# Patient Record
Sex: Female | Born: 2000 | Race: Black or African American | Hispanic: No | Marital: Single | Smoking: Former smoker
Health system: Southern US, Community
[De-identification: ages and names within clinical notes are randomized; demographics above are authoritative.]

## PROBLEM LIST (undated history)

## (undated) ENCOUNTER — Inpatient Hospital Stay: Payer: Self-pay

## (undated) DIAGNOSIS — Q213 Tetralogy of Fallot: Secondary | ICD-10-CM

## (undated) DIAGNOSIS — D563 Thalassemia minor: Secondary | ICD-10-CM

## (undated) DIAGNOSIS — IMO0001 Reserved for inherently not codable concepts without codable children: Secondary | ICD-10-CM

## (undated) DIAGNOSIS — H6690 Otitis media, unspecified, unspecified ear: Secondary | ICD-10-CM

## (undated) DIAGNOSIS — R21 Rash and other nonspecific skin eruption: Secondary | ICD-10-CM

## (undated) DIAGNOSIS — T148XXA Other injury of unspecified body region, initial encounter: Secondary | ICD-10-CM

## (undated) DIAGNOSIS — I519 Heart disease, unspecified: Secondary | ICD-10-CM

## (undated) DIAGNOSIS — H9190 Unspecified hearing loss, unspecified ear: Secondary | ICD-10-CM

## (undated) DIAGNOSIS — K5900 Constipation, unspecified: Secondary | ICD-10-CM

## (undated) DIAGNOSIS — F99 Mental disorder, not otherwise specified: Secondary | ICD-10-CM

## (undated) DIAGNOSIS — R079 Chest pain, unspecified: Secondary | ICD-10-CM

## (undated) DIAGNOSIS — R519 Headache, unspecified: Secondary | ICD-10-CM

## (undated) DIAGNOSIS — K219 Gastro-esophageal reflux disease without esophagitis: Secondary | ICD-10-CM

## (undated) DIAGNOSIS — D539 Nutritional anemia, unspecified: Secondary | ICD-10-CM

## (undated) DIAGNOSIS — R569 Unspecified convulsions: Secondary | ICD-10-CM

## (undated) HISTORY — DX: Headache, unspecified: R51.9

## (undated) HISTORY — PX: HX HEART SURGERY: 2100001149

## (undated) HISTORY — DX: Mental disorder, not otherwise specified: F99

## (undated) HISTORY — DX: Unspecified convulsions: R56.9

## (undated) HISTORY — PX: PB INJECT W CARD CATH LV/LA ANGIO: 93543

## (undated) HISTORY — PX: PB REPR ASD W BYPASS: 33641

## (undated) HISTORY — DX: Otitis media, unspecified, unspecified ear: H66.90

## (undated) HISTORY — DX: Heart disease, unspecified: I51.9

## (undated) HISTORY — PX: PB REPLACEMENT, PULMONARY VALVE: 33475

## (undated) HISTORY — DX: Unspecified hearing loss, unspecified ear: H91.90

## (undated) HISTORY — DX: Chest pain, unspecified: R07.9

## (undated) HISTORY — PX: PB REPR TET FALLOT W PULM ATRESIA: 33697

## (undated) HISTORY — DX: Constipation, unspecified: K59.00

## (undated) HISTORY — DX: Reserved for inherently not codable concepts without codable children: IMO0001

## (undated) HISTORY — DX: Nutritional anemia, unspecified: D53.9

## (undated) HISTORY — PX: CORONARY ARTERY BYPASS GRAFT: SHX141

## (undated) NOTE — Progress Notes (Signed)
 Formatting of this note is different from the original.  Images from the original note were not included.  Subjective   Patient ID: [name] is a 31 y.o. female presenting to the Urgent Care with a chief complaint of Other (She was moving boxes dropped a box onto her chest. Has soreness x1day ).    Pt here today d/t chest wall pain. She states that she was moving out of her home -and placing boxes in storage. She states she was putting a box on a shelf, and then the box fell on her chest. This occurred yesterday. Denies shortness of breath, N/V, diaphoresis, dizziness.      History provided by:  Patient  Language interpreter used: No      Objective   BP 115/77 (BP Location: Right arm, Patient Position: Sitting, BP Cuff Size: Adult)   Pulse (!) 122   Temp 36.8 C (98.3 F) (Temporal)   Resp 18   Ht 1.524 m (5')   Wt 63.5 kg (140 lb)   LMP 02/13/2024   SpO2 99%   BMI 27.34 kg/m     Physical Exam  Vitals and nursing note reviewed.   Constitutional:       Appearance: Normal appearance.   HENT:      Head: Normocephalic.   Eyes:      Extraocular Movements: Extraocular movements intact.      Pupils: Pupils are equal, round, and reactive to light.   Cardiovascular:      Rate and Rhythm: Normal rate and regular rhythm.   Pulmonary:      Effort: Pulmonary effort is normal.      Breath sounds: Normal breath sounds.   Chest:      Chest wall: Tenderness present.        Comments: Tenderness with palpation noted above.    Skin:     General: Skin is warm and dry.      Capillary Refill: Capillary refill takes less than 2 seconds.   Neurological:      General: No focal deficit present.      Mental Status: She is alert and oriented to person, place, and time.   Psychiatric:         Mood and Affect: Mood normal.         Behavior: Behavior normal.         Assessment & Plan    Assessment & Plan  Acute chest wall pain    Orders:    XR chest 2 views    ibuprofen  tablet 600 mg    methocarbamol (Robaxin) 750 MG tablet; Take 1  tablet by mouth every 8 hours if needed for muscle spasms (pain/spasms.) for up to 10 days. Do not drive or operate heavy machinery    Other orders    ibuprofen  600 MG tablet; Take 1 tablet by mouth every 6 hours if needed for mild pain for up to 10 days.    In-House Lab Results:   No results found for this or any previous visit.     In-House Imaging Reads:  X-Ray Wet Read Documentation  XR chest 2 views        Examination  Description: Chest xray, frontal and lateral views    Comparisons:  None provided.      Findings  The cardiomediastinal silhouette is unremarkable. Mediastinal cerclage wires     The lungs are clear. No consolidation is present.     No pleural effusion is noted.  There is no pneumothorax present.     The soft tissues and bones are unremarkable for age.     IMPRESSION:    Negative examination for age. No consolidation.    Electronically signed by: Tanda Lory, MD FACR on 03/08/2024 01:15:27 PM US [place]          PRELIMINARY FINDINGS: Appears negative for rib fx  VIEWS: Chest 2 view  RULE OUT: fx/dislocation  PRELIMINARY READ BY: Lauraine Kirsch, FNP-C  IF ANY DISCREPANCIES WITH FINAL READ BY RADIOLOGIST, PATIENT WILL BE NOTIFIED  OF FINDINGS AND FURTHER INSTRUCTION AND TREATMENT.      Procedure Documentation:  Procedures     ED Course & MDM   MDM - Medical Decision Making: Reviewed previous Chart  Electronically signed by Lauraine Kirsch, CRNP at 03/08/2024  2:00 PM EDT

---
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
## 2019-01-05 MED ADMIN — insulin lispro 100 unit/mL subcutaneous solution: @ 11:00:00

## 2019-01-14 MED ADMIN — enoxaparin 60 mg/0.6 mL subcutaneous syringe: @ 09:00:00

## 2019-04-02 ENCOUNTER — Emergency Department (HOSPITAL_COMMUNITY): Payer: Medicaid - Out of State

## 2019-04-02 ENCOUNTER — Encounter (HOSPITAL_COMMUNITY): Payer: Self-pay | Admitting: *Deleted

## 2019-04-02 ENCOUNTER — Emergency Department (HOSPITAL_COMMUNITY)
Admission: EM | Admit: 2019-04-02 | Discharge: 2019-04-02 | Disposition: A | Discharge status: Home / Self Care | Payer: Medicaid - Out of State | Attending: Emergency Medicine | Admitting: Emergency Medicine

## 2019-04-02 ENCOUNTER — Other Ambulatory Visit: Payer: Self-pay

## 2019-04-02 DIAGNOSIS — R300 Dysuria: Secondary | ICD-10-CM

## 2019-04-02 DIAGNOSIS — R0602 Shortness of breath: Secondary | ICD-10-CM | POA: Diagnosis not present

## 2019-04-02 DIAGNOSIS — Q213 Tetralogy of Fallot: Secondary | ICD-10-CM | POA: Diagnosis not present

## 2019-04-02 DIAGNOSIS — Z951 Presence of aortocoronary bypass graft: Secondary | ICD-10-CM | POA: Diagnosis not present

## 2019-04-02 DIAGNOSIS — R0789 Other chest pain: Secondary | ICD-10-CM

## 2019-04-02 HISTORY — DX: Tetralogy of Fallot: Q21.3

## 2019-04-02 LAB — BASIC METABOLIC PANEL
Anion gap: 6 (ref 5–15)
BUN: 11 mg/dL (ref 6–20)
CO2: 23 mmol/L (ref 22–32)
Calcium: 9.6 mg/dL (ref 8.9–10.3)
Chloride: 105 mmol/L (ref 98–111)
Creatinine, Ser: 0.67 mg/dL (ref 0.44–1.00)
GFR calc Af Amer: >60 mL/min (ref >60)
GFR calc non Af Amer: >60 mL/min (ref >60)
Glucose, Bld: 118 mg/dL — ABNORMAL HIGH (ref 70–99)
Potassium: 3.6 mmol/L (ref 3.5–5.1)
Sodium: 134 mmol/L — ABNORMAL LOW (ref 135–145)

## 2019-04-02 LAB — URINALYSIS, ROUTINE W REFLEX MICROSCOPIC
Bacteria, UA: NONE SEEN
Bilirubin Urine: NEGATIVE
Glucose, UA: NEGATIVE mg/dL
Hgb urine dipstick: NEGATIVE
Ketones, ur: NEGATIVE mg/dL
Leukocytes,Ua: NEGATIVE
Nitrite: NEGATIVE
Protein, ur: NEGATIVE mg/dL
Specific Gravity, Urine: 1.017 (ref 1.005–1.030)
pH: 7 (ref 5.0–8.0)

## 2019-04-02 LAB — CBC
HCT: 41 % (ref 36.0–46.0)
Hemoglobin: 13.3 g/dL (ref 12.0–15.0)
MCH: 26.4 pg (ref 26.0–34.0)
MCHC: 32.4 g/dL (ref 30.0–36.0)
MCV: 81.3 fL (ref 80.0–100.0)
Platelets: 334 10*3/uL (ref 150–400)
RBC: 5.04 MIL/uL (ref 3.87–5.11)
RDW: 14.5 % (ref 11.5–15.5)
WBC: 7.8 10*3/uL (ref 4.0–10.5)
nRBC: 0 % (ref 0.0–0.2)

## 2019-04-02 LAB — I-STAT BETA HCG BLOOD, ED (MC, WL, AP ONLY): I-stat hCG, quantitative: <5 m[IU]/mL (ref <5)

## 2019-04-02 LAB — TROPONIN I (HIGH SENSITIVITY): Troponin I (High Sensitivity): <2 ng/L (ref <18)

## 2019-04-02 MED ORDER — SODIUM CHLORIDE 0.9% FLUSH
3.0000 mL | Freq: Once | INTRAVENOUS | Status: AC
  Administered 2019-04-02: 3 mL via INTRAVENOUS

## 2019-04-02 NOTE — ED Notes (Signed)
2nd trop not needed at this time

## 2019-04-02 NOTE — ED Notes (Signed)
Pt verbalized understanding of discharge and follow-up appts. She has no further complaints or questions

## 2019-04-02 NOTE — ED Provider Notes (Signed)
[facility] EMERGENCY DEPARTMENT Provider Note   CSN: [id number] Arrival date & time: 04/02/19  2028     History   Chief Complaint Chief Complaint  Patient presents with  . Chest Pain    HPI Claire Christensen is a 18 y.o. female.     Pt with hx of tetrology of fallot presents with chest pain and intermittent shortness of breath.  Worse past 3 days and with flexion of her head.  Mild urinary sxs and back pain chronic from epidural hx.  [facility], all surgeries done there.  9 mo had stent placed, 18 yo tissue valve and 18 yo had cardiac surgeries.  Most recent pig valve placement.  Compliant with aspirin.  Recently moved to area and looking for local doctors.   No syncope.  No covid contacts.   Dr Barnabas Harries peds cardiology.      Past Medical History:  Diagnosis Date  . Tetralogy of Fallot     There are no active problems to display for this patient.   Past Surgical History:  Procedure Laterality Date  . CORONARY ARTERY BYPASS GRAFT       OB History   No obstetric history on file.      Home Medications    Prior to Admission medications   Medication Sig Start Date End Date Taking? Authorizing Provider  aspirin EC 81 MG tablet Take 81 mg by mouth daily.   Yes [provider]    Family History No family history on file.  Social History Social History   Tobacco Use  . Smoking status: Never Smoker  Substance Use Topics  . Alcohol use: Yes  . Drug use: Not Currently     Allergies   Augmentin [amoxicillin-pot clavulanate], Omnicef [cefdinir], and Penicillins   Review of Systems Review of Systems  Constitutional: Negative for chills and fever.  HENT: Negative for congestion.   Eyes: Negative for visual disturbance.  Respiratory: Positive for shortness of breath (intermittent). Negative for cough.   Cardiovascular: Positive for chest pain.  Gastrointestinal: Positive for nausea. Negative for  abdominal pain and vomiting.  Genitourinary: Positive for dysuria. Negative for flank pain.  Musculoskeletal: Negative for back pain, neck pain and neck stiffness.  Skin: Negative for rash.  Neurological: Positive for light-headedness. Negative for headaches.     Physical Exam Updated Vital Signs BP 110/79   Pulse 89   Temp 99.3 F (37.4 C) (Oral)   Resp (!) 21   SpO2 98%   Physical Exam Vitals signs and nursing note reviewed.  Constitutional:      Appearance: She is well-developed.  HENT:     Head: Normocephalic and atraumatic.  Eyes:     General:        Right eye: No discharge.        Left eye: No discharge.     Conjunctiva/sclera: Conjunctivae normal.  Neck:     Musculoskeletal: Normal range of motion and neck supple.     Trachea: No tracheal deviation.  Cardiovascular:     Rate and Rhythm: Normal rate and regular rhythm.     Heart sounds: Murmur present. Systolic murmur present with a grade of 3/6.  Pulmonary:     Effort: Pulmonary effort is normal.     Breath sounds: Normal breath sounds.  Abdominal:     General: There is no distension.     Palpations: Abdomen is soft.     Tenderness: There is no abdominal tenderness. There is  no guarding.  Skin:    General: Skin is warm.     Findings: No rash.  Neurological:     General: No focal deficit present.     Mental Status: She is alert and oriented to person, place, and time.      ED Treatments / Results  Labs (all labs ordered are listed, but only abnormal results are displayed) Labs Reviewed  BASIC METABOLIC PANEL - Abnormal; Notable for the following components:      Result Value   Sodium 134 (*)    Glucose, Bld 118 (*)    All other components within normal limits  URINALYSIS, ROUTINE W REFLEX MICROSCOPIC - Abnormal; Notable for the following components:   APPearance TURBID (*)    All other components within normal limits  CBC  I-STAT BETA HCG BLOOD, ED ([facility], [facility], [facility] ONLY)  TROPONIN I (HIGH SENSITIVITY)   TROPONIN I (HIGH SENSITIVITY)    EKG EKG Interpretation  Date/Time:  Sunday April 02 2019 20:44:30 EDT Ventricular Rate:  89 PR Interval:  156 QRS Duration: 148 QT Interval:  386 QTC Calculation: 469 R Axis:   -88 Text Interpretation:  Normal sinus rhythm Left axis deviation Right bundle branch block Abnormal ECG Confirmed by Elnora Morrison [phone]) on 04/02/2019 10:33:54 PM   Radiology Dg Chest 2 View  Result Date: 04/02/2019 CLINICAL DATA:  Left-sided chest pain. History of tetralogy of Fallot. EXAM: CHEST - 2 VIEW COMPARISON:  None. FINDINGS: Remote median sternotomy. The heart is normal in size. Left hilar prominence likely related to congenital heart disease. No pulmonary edema, focal airspace disease, pleural effusion or pneumothorax. No acute osseous abnormalities are seen. IMPRESSION: 1. No acute abnormality. 2. History of congenital heart disease post median sternotomy, heart size is normal. Electronically Signed   By: Keith Rake M.D.   On: 04/02/2019 21:09    Procedures Procedures (including critical care time)  Medications Ordered in ED Medications  sodium chloride flush (NS) 0.9 % injection 3 mL (3 mLs Intravenous Given 04/02/19 2135)     Initial Impression / Assessment and Plan / ED Course  I have reviewed the triage vital signs and the nursing notes.  Pertinent labs & imaging results that were available during my care of the patient were reviewed by me and considered in my medical decision making (see chart for details).       Patient with tetralogy of fallot history and recently moved to [facility] looking to follow-up with pediatric cardiologist for continued management.  Patient has no significant symptoms currently in the ER she has had intermittent shortness of breath, upper chest discomfort positional and lightheadedness.  Patient is well-appearing on my exam, vital signs normal, lungs are clear.  Discussed in detail the importance of obtaining  cardiac specialist, primary doctor and other specialist needed now that they are settled in a new city.  Mother is extremely helpful and motivated to make these phone calls on Monday.  Patient and mother understand they can call any specialist they would like from different hospitalist if needed.  Reasons to return given.  Urinalysis reviewed no infection, blood work reviewed troponin negative, sodium 134, no other acute abnormalities.  EKG reviewed unfortunately no old 1 to compare to however mother will obtain records to bring to cardiology appointment this week.  Patient stable for close outpatient follow-up.  Final Clinical Impressions(s) / ED Diagnoses   Final diagnoses:  Atypical chest pain  Tetralogy of Fallot  Dysuria    ED Discharge Orders  None       Blane OharaZavitz, Elaf Clauson, MD 04/02/19 [phone]

## 2019-04-02 NOTE — ED Triage Notes (Signed)
Left sided chest pain that started about 3 days ago. Some shortness of breath, dizziness, and nausea. Denies cough or fevers. Pt says that she always has lower back pain, but it has been worse.

## 2019-04-02 NOTE — Discharge Instructions (Addendum)
Please call for an appointment with local cardiologist. If you develop worsening shortness of breath, passing out or new symptoms come immediately to the emergency room. It is important for you to set up an appointment with a family doctor to help coordinate your care for which they may also be comfortable with your psychiatric history as well.  If you need urgent assistance for depression or behavioral health related you can see behavioral health Hospital.

## 2019-04-05 ENCOUNTER — Emergency Department (HOSPITAL_COMMUNITY): Payer: MEDICARE

## 2019-04-05 ENCOUNTER — Encounter (HOSPITAL_COMMUNITY): Payer: Self-pay | Admitting: Emergency Medicine

## 2019-04-05 ENCOUNTER — Emergency Department (HOSPITAL_COMMUNITY)
Admission: EM | Admit: 2019-04-05 | Discharge: 2019-04-05 | Disposition: A | Discharge status: Home / Self Care | Payer: MEDICARE | Attending: Emergency Medicine | Admitting: Emergency Medicine

## 2019-04-05 ENCOUNTER — Other Ambulatory Visit: Payer: Self-pay

## 2019-04-05 DIAGNOSIS — Q213 Tetralogy of Fallot: Secondary | ICD-10-CM | POA: Insufficient documentation

## 2019-04-05 DIAGNOSIS — R682 Dry mouth, unspecified: Secondary | ICD-10-CM | POA: Diagnosis not present

## 2019-04-05 DIAGNOSIS — G8929 Other chronic pain: Secondary | ICD-10-CM

## 2019-04-05 DIAGNOSIS — R0602 Shortness of breath: Secondary | ICD-10-CM | POA: Diagnosis not present

## 2019-04-05 DIAGNOSIS — M545 Low back pain, unspecified: Secondary | ICD-10-CM

## 2019-04-05 DIAGNOSIS — R0600 Dyspnea, unspecified: Secondary | ICD-10-CM | POA: Insufficient documentation

## 2019-04-05 DIAGNOSIS — Z7982 Long term (current) use of aspirin: Secondary | ICD-10-CM | POA: Diagnosis not present

## 2019-04-05 DIAGNOSIS — G4700 Insomnia, unspecified: Secondary | ICD-10-CM | POA: Diagnosis not present

## 2019-04-05 DIAGNOSIS — R42 Dizziness and giddiness: Secondary | ICD-10-CM | POA: Diagnosis not present

## 2019-04-05 DIAGNOSIS — Z951 Presence of aortocoronary bypass graft: Secondary | ICD-10-CM | POA: Insufficient documentation

## 2019-04-05 DIAGNOSIS — R079 Chest pain, unspecified: Secondary | ICD-10-CM | POA: Diagnosis present

## 2019-04-05 DIAGNOSIS — R200 Anesthesia of skin: Secondary | ICD-10-CM | POA: Insufficient documentation

## 2019-04-05 DIAGNOSIS — R0789 Other chest pain: Secondary | ICD-10-CM | POA: Diagnosis not present

## 2019-04-05 LAB — CBC
HCT: 38.2 % (ref 36.0–46.0)
Hemoglobin: 13.4 g/dL (ref 12.0–15.0)
MCH: 28 pg (ref 26.0–34.0)
MCHC: 35.1 g/dL (ref 30.0–36.0)
MCV: 79.7 fL — ABNORMAL LOW (ref 80.0–100.0)
Platelets: 313 10*3/uL (ref 150–400)
RBC: 4.79 MIL/uL (ref 3.87–5.11)
RDW: 14.2 % (ref 11.5–15.5)
WBC: 7 10*3/uL (ref 4.0–10.5)
nRBC: 0 % (ref 0.0–0.2)

## 2019-04-05 LAB — BASIC METABOLIC PANEL
Anion gap: 10 (ref 5–15)
BUN: 10 mg/dL (ref 6–20)
CO2: 19 mmol/L — ABNORMAL LOW (ref 22–32)
Calcium: 9.3 mg/dL (ref 8.9–10.3)
Chloride: 108 mmol/L (ref 98–111)
Creatinine, Ser: 0.7 mg/dL (ref 0.44–1.00)
GFR calc Af Amer: >60 mL/min (ref >60)
GFR calc non Af Amer: >60 mL/min (ref >60)
Glucose, Bld: 96 mg/dL (ref 70–99)
Potassium: 3.7 mmol/L (ref 3.5–5.1)
Sodium: 137 mmol/L (ref 135–145)

## 2019-04-05 LAB — TROPONIN I (HIGH SENSITIVITY)
Troponin I (High Sensitivity): <2 ng/L (ref <18)
Troponin I (High Sensitivity): <2 ng/L (ref <18)

## 2019-04-05 MED ORDER — SODIUM CHLORIDE 0.9% FLUSH: 3.0000 mL | Freq: Once | INTRAVENOUS | Status: DC

## 2019-04-05 MED ORDER — LORAZEPAM 1 MG PO TABS
1.0000 mg | ORAL_TABLET | Freq: Once | ORAL | Status: AC
  Administered 2019-04-05: 07:00:00 1 mg via ORAL
  Filled 2019-04-05: qty 1

## 2019-04-05 NOTE — ED Triage Notes (Signed)
Pt c/o central chest pain and shortness of breath x 4 days. Denies fever or cough.

## 2019-04-05 NOTE — ED Provider Notes (Signed)
[facility] EMERGENCY DEPARTMENT Provider Note   CSN: [id number] Arrival date & time: 04/05/19  0307    History   Chief Complaint Chief Complaint  Patient presents with  . Chest Pain  . Shortness of Breath    HPI Claire Christensen is a 18 y.o. female. She has history of tetralogy of flow, depression, chronic back pain and comes in with episodes of chest pain over last week.  Pain will feel different each time it comes on, and will last up to 45 minutes.  Pain is always left-sided.  There is mild associated dyspnea but no nausea or diaphoresis.  She denies any cough or fever.  She denies exposure to COVID-19.  She did have an episode tonight where her hands and feet got numb and her face got numb.  This was associated with dry mouth and with lightheadedness.  She also has been having difficulty with insomnia.  Her psychiatrist from [place] prescribed Remeron 15 mg and mother states that she stopped ever a long time after the first dose, but is not sleeping well at all with subsequent doses.  Apparently, her psychiatrist suggested that she might need to increase to 30 mg.  She also is complaining of chronic back pain.  She is has had for epidural injections in the past, all of which only give temporary relief.  The history is provided by the patient and a parent.  Chest Pain Associated symptoms: shortness of breath   Shortness of Breath Associated symptoms: chest pain     Past Medical History:  Diagnosis Date  . Tetralogy of Fallot     There are no active problems to display for this patient.   Past Surgical History:  Procedure Laterality Date  . CORONARY ARTERY BYPASS GRAFT       OB History   No obstetric history on file.      Home Medications    Prior to Admission medications   Medication Sig Start Date End Date Taking? Authorizing Provider  aspirin EC 81 MG tablet Take 81 mg by mouth daily.   Yes [provider]    Family History  No family history on file.  Social History Social History   Tobacco Use  . Smoking status: Never Smoker  . Smokeless tobacco: Never Used  Substance Use Topics  . Alcohol use: Yes  . Drug use: Not Currently     Allergies   Augmentin [amoxicillin-pot clavulanate], Omnicef [cefdinir], Penicillins, and Zithromax [azithromycin]   Review of Systems Review of Systems  Respiratory: Positive for shortness of breath.   Cardiovascular: Positive for chest pain.  All other systems reviewed and are negative.    Physical Exam Updated Vital Signs BP 118/84   Pulse 97   Temp 98.3 F (36.8 C) (Oral)   Resp (!) 23   SpO2 99%   Physical Exam Vitals signs and nursing note reviewed.    18 year old female, resting comfortably and in no acute distress. Vital signs are significant for elevated respiratory rate. Oxygen saturation is 99%, which is normal. Head is normocephalic and atraumatic. PERRLA, EOMI. Oropharynx is clear. Neck is nontender and supple without adenopathy or JVD. Back is nontender and there is no CVA tenderness. Lungs are clear without rales, wheezes, or rhonchi. Chest is nontender. Heart has regular rate and rhythm with 2/6 early systolic murmur, loud and fixed split S2. Abdomen is soft, flat, nontender without masses or hepatosplenomegaly and peristalsis is normoactive. Extremities have no cyanosis or  edema, full range of motion is present. Skin is warm and dry without rash. Neurologic: Mental status is normal, cranial nerves are intact, there are no motor or sensory deficits.  ED Treatments / Results  Labs (all labs ordered are listed, but only abnormal results are displayed) Labs Reviewed  BASIC METABOLIC PANEL - Abnormal; Notable for the following components:      Result Value   CO2 19 (*)    All other components within normal limits  CBC - Abnormal; Notable for the following components:   MCV 79.7 (*)    All other components within normal limits  I-STAT  BETA HCG BLOOD, ED ([facility], [facility], [facility] ONLY)  TROPONIN I (HIGH SENSITIVITY)  TROPONIN I (HIGH SENSITIVITY)    EKG EKG Interpretation  Date/Time:  Wednesday April 05 2019 05:32:55 EDT Ventricular Rate:  85 PR Interval:  174 QRS Duration: 144 QT Interval:  409 QTC Calculation: 487 R Axis:   83 Text Interpretation:  Sinus rhythm Right bundle branch block When compared with ECG of EARLIER SAME DATE No significant change was found Confirmed by Dione Booze ([id number]) on 04/05/2019 5:43:06 AM   Radiology Dg Chest 2 View  Result Date: 04/05/2019 CLINICAL DATA:  Chest pain. Shortness of breath. EXAM: CHEST - 2 VIEW COMPARISON:  04/02/2019 FINDINGS: Remote median sternotomy.The cardiomediastinal contours are unchanged, heart is normal in size. The lungs are clear. Pulmonary vasculature is normal. No consolidation, pleural effusion, or pneumothorax. No acute osseous abnormalities are seen. IMPRESSION: No acute abnormality or change from prior exam. Electronically Signed   By: Narda Rutherford M.D.   On: 04/05/2019 03:59    Procedures Procedures   Medications Ordered in ED Medications  sodium chloride flush (NS) 0.9 % injection 3 mL (has no administration in time range)  LORazepam (ATIVAN) tablet 1 mg (has no administration in time range)     Initial Impression / Assessment and Plan / ED Course  I have reviewed the triage vital signs and the nursing notes.  Pertinent labs & imaging results that were available during my care of the patient were reviewed by me and considered in my medical decision making (see chart for details).  Atypical chest pain in patient with tetralogy of Fallot.  Chest x-ray and ECG are unchanged.  Labs are unremarkable.  No evidence of serious pathology.  Mother is concerned because symptoms are similar to what she had prior to having cardiac surgery, but surgery was set up for months in advance.  I do not see any indication for emergent interventions.  She also has chronic  back pain, recommended she use over-the-counter NSAIDs and discuss getting established with physical therapy, also given referral to neurosurgery.  Insomnia is probably related to her underlying depression.  She needs to get established with a local psychiatrist.  Numbness and lightheadedness appears to be hyperventilation.  No evidence of stroke on exam.  She is given a dose of lorazepam.  Major concern today is need to become established with cardiology.  I have discussed with Dr. Eldridge Dace who recommends that she be referred to Dr. Deatra James, who specializes in treating adults with congenital heart disease.  I have explained this to the patient to her mother.  Old records are reviewed showing ED visit 3 days ago at which time she was referred to pediatric cardiology.  I have suggested that it would probably be better for her to see the cardiologist who specializes in adults with congenital heart disease.  Patient and mother were satisfied  with this.  Final Clinical Impressions(s) / ED Diagnoses   Final diagnoses:  Atypical chest pain  Tetralogy of Fallot  Insomnia, unspecified type  Chronic midline low back pain without sciatica    ED Discharge Orders    None       Delora Fuel, MD 58/52/77 [phone]

## 2019-04-05 NOTE — Discharge Instructions (Signed)
Please make an appointment with the cardiologist - he specializes in the care of adults with congenital heart disease, like Tetralogy of Fallot.  For your back, try applying ice for thirty minutes at a time. You may take ibuprofen or naproxen as needed for pain.  Please get established with a psychiatrist for ongoing management of your depression and insomnia. In the meantime, follow the instructions from your psychiatrist in [place].

## 2019-04-13 DIAGNOSIS — Q213 Tetralogy of Fallot: Secondary | ICD-10-CM | POA: Insufficient documentation

## 2019-04-13 DIAGNOSIS — I4510 Unspecified right bundle-branch block: Secondary | ICD-10-CM | POA: Insufficient documentation

## 2019-04-13 DIAGNOSIS — Z953 Presence of xenogenic heart valve: Secondary | ICD-10-CM | POA: Insufficient documentation

## 2019-05-27 ENCOUNTER — Other Ambulatory Visit: Payer: Self-pay

## 2019-05-27 ENCOUNTER — Inpatient Hospital Stay (HOSPITAL_COMMUNITY)
Admission: EM | Admit: 2019-05-27 | Discharge: 2019-05-27 | Disposition: A | Discharge status: Home / Self Care | Payer: Medicaid Other | Attending: Obstetrics and Gynecology | Admitting: Obstetrics and Gynecology

## 2019-05-27 DIAGNOSIS — Z3202 Encounter for pregnancy test, result negative: Secondary | ICD-10-CM

## 2019-05-27 LAB — HCG, QUANTITATIVE, PREGNANCY: hCG, Beta Chain, Quant, S: <1 m[IU]/mL (ref <5)

## 2019-05-27 LAB — CBC
HCT: 42.6 % (ref 36.0–46.0)
Hemoglobin: 13.9 g/dL (ref 12.0–15.0)
MCH: 26.4 pg (ref 26.0–34.0)
MCHC: 32.6 g/dL (ref 30.0–36.0)
MCV: 81 fL (ref 80.0–100.0)
Platelets: 342 10*3/uL (ref 150–400)
RBC: 5.26 MIL/uL — ABNORMAL HIGH (ref 3.87–5.11)
RDW: 14.1 % (ref 11.5–15.5)
WBC: 6.2 10*3/uL (ref 4.0–10.5)
nRBC: 0 % (ref 0.0–0.2)

## 2019-05-27 LAB — ABO/RH: ABO/RH(D): O POS

## 2019-05-27 LAB — POCT PREGNANCY, URINE: Preg Test, Ur: NEGATIVE

## 2019-05-27 NOTE — Discharge Instructions (Signed)
Home Pregnancy Test Information ° °A home pregnancy test helps you determine whether you are pregnant or not. There are several types of home pregnancy tests that can be bought at a grocery store or pharmacy. °What is being tested? °A home pregnancy test detects the presence of a hormone in your urine. The hormone is produced by cells of the placenta (human chorionic gonadotropin, or hCG). The placenta is the organ that forms to nourish and support a developing baby. °How are pregnancy tests done? °Home pregnancy tests require a urine sample. °· Most kits use a plastic testing device with a strip of paper that indicates whether there is hCG in your urine. °· Follow the test package instructions very carefully for how to test your urine. Depending on the test, you may need to: °? Urinate directly onto the stick. °? Urinate into a cup. °· Wait for the results as directed by the package instructions. The amount of time may be different for each type of test. °· Follow the test package instructions for how to read your test results. Depending on the test, results may be displayed as: °? A plus or a minus sign. °? One or two lines. °? "Pregnant" or "not pregnant." °· For best results, use your first urine of the morning. That is when the concentration of hCG is highest. °How accurate are home pregnancy tests? °Home pregnancy tests are very accurate when: °· You are at least 3-[redacted] weeks pregnant. °· It has been 1-2 weeks since your missed period. °· You use the test according to the package instructions. °What can interfere with home pregnancy test results? °Sometimes, a home pregnancy test may report that you are pregnant when you are not pregnant (false-positive result). This can happen if you: °· Are taking certain medicines, such as: °? Medicine to control seizures. °? Anti-anxiety medicine. °? Fertility medicine with hCG. °· Have a medical condition that affects your hormone levels. °· Had a recent pregnancy loss  (miscarriage) or abortion. °Sometimes, a home pregnancy test may report that you are not pregnant when you are pregnant (false-negative result). This can happen if you: °· Took the test too early in your pregnancy. Before 3-4 weeks of pregnancy, there may not be enough hCG to detect. °· Drank a lot of liquid before the test. °· Used an expired pregnancy test. °· Are taking certain medicines, such as antihistamines or water pills (diuretics). °What should I do if I have a positive pregnancy test? °If you have a positive home pregnancy test, schedule an appointment with your health care provider. You might need additional testing to confirm the pregnancy. °What should I do if I have a negative pregnancy test? °If you have a negative home pregnancy test but still have symptoms of pregnancy, contact your health care provider. Your health care provider will test a sample of your blood to check for pregnancy. In some cases, a blood test will return a positive result even if a urine test was negative because blood tests are more sensitive. This means blood tests can detect hCG earlier than home pregnancy tests. °Follow these instructions at home: °If you are pregnant, planning to become pregnant, or think you may be pregnant: °· Do not drink alcohol. °· Do not use street drugs. °· Do not use any products that contain nicotine or tobacco, such as cigarettes and e-cigarettes. If you need help quitting, ask your health care provider. °· Take a prenatal vitamin that contains at least 400 mcg of folic   acid daily. °Summary °· A home pregnancy test helps you determine whether you are pregnant or not by detecting the presence of the hormone human chorionic gonadotropin (hCG) in a sample of your urine. °· Follow the test package instructions very carefully. For best results, use your first urine of the morning. That is when the concentration of hCG is highest. °· Home pregnancy tests are very accurate when you are 3-[redacted] weeks  pregnant or when it has been 1-2 weeks since your missed period. °· A home pregnancy test may report that you are pregnant when you are not pregnant or that you are not pregnant when you are pregnant. °· Contact your health care provider to confirm your results. Your health care provider will test a sample of your blood to check for pregnancy. °This information is not intended to replace advice given to you by your health care provider. Make sure you discuss any questions you have with your health care provider. °Document Released: 07/02/2003 Document Revised: 10/20/2018 Document Reviewed: 07/12/2017 °Elsevier Patient Education © 2020 [employer] Inc[employer] ° °

## 2019-05-27 NOTE — MAU Provider Note (Signed)
First Provider Initiated Contact with Patient 05/27/19 1505      S Ms. Taimi Towe is a 18 y.o. No obstetric history on file. non-pregnant female who presents to MAU today with pregnancy symptoms. States she had a "faint positive" pregnant test at home on Monday.    O BP 125/69 (BP Location: Right Arm)   Pulse 83   Temp 98.5 F (36.9 C) (Oral)   Resp 17   Ht 5' (1.524 m)   Wt 55.2 kg   SpO2 100%   BMI 23.79 kg/m  Physical Exam  Nursing note and vitals reviewed. Constitutional: She appears well-developed and well-nourished. No distress.  Respiratory: Effort normal. No respiratory distress.  Skin: She is not diaphoretic.  Psychiatric: She has a normal mood and affect. Her behavior is normal. Judgment and thought content normal.   Results for orders placed or performed during the hospital encounter of 05/27/19 (from the past 24 hour(s))  Pregnancy, urine POC     Status: None   Collection Time: 05/27/19  2:34 PM  Result Value Ref Range   Preg Test, Ur NEGATIVE NEGATIVE  ABO/Rh     Status: None   Collection Time: 05/27/19  3:25 PM  Result Value Ref Range   ABO/RH(D) O POS    No rh immune globuloin      NOT A RH IMMUNE GLOBULIN CANDIDATE, PT RH POSITIVE Performed at [facility], [place] [address] [place], [place] [address]   CBC     Status: Abnormal   Collection Time: 05/27/19  3:25 PM  Result Value Ref Range   WBC 6.2 4.0 - 10.5 K/uL   RBC 5.26 (H) 3.87 - 5.11 MIL/uL   Hemoglobin 13.9 12.0 - 15.0 g/dL   HCT 42.6 36.0 - 46.0 %   MCV 81.0 80.0 - 100.0 fL   MCH 26.4 26.0 - 34.0 pg   MCHC 32.6 30.0 - 36.0 g/dL   RDW 14.1 11.5 - 15.5 %   Platelets 342 150 - 400 K/uL   nRBC 0.0 0.0 - 0.2 %  hCG, quantitative, pregnancy     Status: None   Collection Time: 05/27/19  3:25 PM  Result Value Ref Range   hCG, Beta Chain, Quant, S <1 <5 mIU/mL     A Non pregnant female Medical screening exam complete - UPT & HCG negative  P Discharge from MAU in stable  condition Patient given the option of transfer to [facility] for further evaluation or seek care in outpatient facility of choice List of options for follow-up given  Warning signs for worsening condition that would warrant emergency follow-up discussed Patient may return to MAU as needed for pregnancy related complaints  Jorje Guild, NP 05/27/2019 4:16 PM

## 2019-05-27 NOTE — MAU Note (Signed)
Claire Christensen is a 18 y.o. here in MAU reporting: here to check and see if she is pregnant. Has been having lightheaded, fatigue, nausea, spotting, and tender breasts. Had 1 neg UPT and 1 faint pos UPT. Also having abdominal pain.   LMP: has been on depo, last injection was 4-5 months ago, unsure of LMP  Onset of complaint: ongoing  Pain score:  4/10  Vitals:   05/27/19 1328 05/27/19 1501  BP: 128/82 125/69  Pulse: 91 83  Resp: 16 17  Temp: 99 F (37.2 C) 98.5 F (36.9 C)  SpO2: 100% 100%     Lab orders placed from triage: UPT

## 2019-06-05 ENCOUNTER — Other Ambulatory Visit (INDEPENDENT_AMBULATORY_CARE_PROVIDER_SITE_OTHER): Payer: Self-pay | Admitting: Internal Medicine

## 2019-06-05 DIAGNOSIS — D561 Beta thalassemia: Secondary | ICD-10-CM

## 2019-06-05 DIAGNOSIS — Z952 Presence of prosthetic heart valve: Secondary | ICD-10-CM

## 2019-06-05 DIAGNOSIS — Q213 Tetralogy of Fallot: Secondary | ICD-10-CM

## 2019-06-07 ENCOUNTER — Ambulatory Visit (INDEPENDENT_AMBULATORY_CARE_PROVIDER_SITE_OTHER): Payer: Self-pay

## 2019-06-07 ENCOUNTER — Encounter (INDEPENDENT_AMBULATORY_CARE_PROVIDER_SITE_OTHER): Payer: Self-pay | Admitting: Internal Medicine

## 2019-08-13 ENCOUNTER — Encounter (HOSPITAL_COMMUNITY): Payer: Self-pay | Admitting: Emergency Medicine

## 2019-08-13 ENCOUNTER — Other Ambulatory Visit: Payer: Self-pay

## 2019-08-13 ENCOUNTER — Emergency Department (HOSPITAL_COMMUNITY)
Admission: EM | Admit: 2019-08-13 | Discharge: 2019-08-14 | Disposition: A | Discharge status: Home / Self Care | Payer: Medicaid Other | Attending: Emergency Medicine | Admitting: Emergency Medicine

## 2019-08-13 DIAGNOSIS — Z951 Presence of aortocoronary bypass graft: Secondary | ICD-10-CM | POA: Diagnosis not present

## 2019-08-13 DIAGNOSIS — O9229 Other disorders of breast associated with pregnancy and the puerperium: Secondary | ICD-10-CM | POA: Diagnosis not present

## 2019-08-13 DIAGNOSIS — O26891 Other specified pregnancy related conditions, first trimester: Secondary | ICD-10-CM | POA: Insufficient documentation

## 2019-08-13 DIAGNOSIS — Z3490 Encounter for supervision of normal pregnancy, unspecified, unspecified trimester: Secondary | ICD-10-CM | POA: Diagnosis not present

## 2019-08-13 DIAGNOSIS — Z7982 Long term (current) use of aspirin: Secondary | ICD-10-CM | POA: Insufficient documentation

## 2019-08-13 DIAGNOSIS — R110 Nausea: Secondary | ICD-10-CM | POA: Insufficient documentation

## 2019-08-13 DIAGNOSIS — R1030 Lower abdominal pain, unspecified: Secondary | ICD-10-CM | POA: Insufficient documentation

## 2019-08-13 DIAGNOSIS — Z20822 Contact with and (suspected) exposure to covid-19: Secondary | ICD-10-CM | POA: Diagnosis not present

## 2019-08-13 MED ORDER — SODIUM CHLORIDE 0.9% FLUSH: 3.0000 mL | Freq: Once | INTRAVENOUS | Status: DC

## 2019-08-13 NOTE — ED Triage Notes (Signed)
Patient reports mid abdominal pain/cramping onset this week with mild nausea and occasional diarrhea , denies fever or chills .

## 2019-08-14 ENCOUNTER — Emergency Department (HOSPITAL_COMMUNITY): Payer: Medicaid Other

## 2019-08-14 LAB — URINALYSIS, ROUTINE W REFLEX MICROSCOPIC
Bilirubin Urine: NEGATIVE
Glucose, UA: NEGATIVE mg/dL
Hgb urine dipstick: NEGATIVE
Ketones, ur: NEGATIVE mg/dL
Leukocytes,Ua: NEGATIVE
Nitrite: NEGATIVE
Protein, ur: NEGATIVE mg/dL
Specific Gravity, Urine: 1.027 (ref 1.005–1.030)
pH: 6 (ref 5.0–8.0)

## 2019-08-14 LAB — COMPREHENSIVE METABOLIC PANEL
ALT: 13 U/L (ref 0–44)
AST: 14 U/L — ABNORMAL LOW (ref 15–41)
Albumin: 3.7 g/dL (ref 3.5–5.0)
Alkaline Phosphatase: 84 U/L (ref 38–126)
Anion gap: 10 (ref 5–15)
BUN: 15 mg/dL (ref 6–20)
CO2: 21 mmol/L — ABNORMAL LOW (ref 22–32)
Calcium: 9 mg/dL (ref 8.9–10.3)
Chloride: 106 mmol/L (ref 98–111)
Creatinine, Ser: 0.55 mg/dL (ref 0.44–1.00)
GFR calc Af Amer: >60 mL/min (ref >60)
GFR calc non Af Amer: >60 mL/min (ref >60)
Glucose, Bld: 89 mg/dL (ref 70–99)
Potassium: 3.8 mmol/L (ref 3.5–5.1)
Sodium: 137 mmol/L (ref 135–145)
Total Bilirubin: 0.6 mg/dL (ref 0.3–1.2)
Total Protein: 6.8 g/dL (ref 6.5–8.1)

## 2019-08-14 LAB — HCG, QUANTITATIVE, PREGNANCY: hCG, Beta Chain, Quant, S: 483 m[IU]/mL — ABNORMAL HIGH (ref <5)

## 2019-08-14 LAB — CBC
HCT: 38.7 % (ref 36.0–46.0)
Hemoglobin: 12.6 g/dL (ref 12.0–15.0)
MCH: 26.1 pg (ref 26.0–34.0)
MCHC: 32.6 g/dL (ref 30.0–36.0)
MCV: 80.3 fL (ref 80.0–100.0)
Platelets: 337 10*3/uL (ref 150–400)
RBC: 4.82 MIL/uL (ref 3.87–5.11)
RDW: 13.9 % (ref 11.5–15.5)
WBC: 9.4 10*3/uL (ref 4.0–10.5)
nRBC: 0 % (ref 0.0–0.2)

## 2019-08-14 LAB — POC SARS CORONAVIRUS 2 AG -  ED: SARS Coronavirus 2 Ag: NEGATIVE

## 2019-08-14 NOTE — ED Notes (Signed)
Called lab and spoke to [place] in regards to running [employer]	[place] off lt green and she state she thinks it needs to be a dark green top but if order is change they can run test in main lab.

## 2019-08-14 NOTE — ED Provider Notes (Signed)
[facility] EMERGENCY DEPARTMENT Provider Note   CSN: [id number] Arrival date & time: 08/13/19  2340     History Chief Complaint  Patient presents with  . Abdominal Pain    Claire Christensen is a 19 y.o. female with a hx of Tetralogy of Fallot presents to the Emergency Department complaining of gradual, persistent, progressively worsening lower abdominal pain onset yesterday.  Patient reports she developed associated nausea and bilateral breast tenderness this afternoon.  She reports that she is sexually active with one female partner.  She is unconcerned about STDs.  She reports utilizing Depo-Provera as her method of birth control however her last injection was 5 months ago.  She has not had a menstrual cycle since she had her last injection.  Patient also reports nonbloody loose stools today.  No vomiting.  She denies known Covid contacts.  She denies fever, chills, chest pain, cough, weakness, dizziness, syncope, dysuria, hematuria.  She does report some new vaginal discharge but this is not malodorous.  She denies vaginal bleeding.  The history is provided by the patient and medical records. No language interpreter was used.       Past Medical History:  Diagnosis Date  . Tetralogy of Fallot     There are no problems to display for this patient.   Past Surgical History:  Procedure Laterality Date  . CORONARY ARTERY BYPASS GRAFT       OB History   No obstetric history on file.     No family history on file.  Social History   Tobacco Use  . Smoking status: Never Smoker  . Smokeless tobacco: Never Used  Substance Use Topics  . Alcohol use: Yes  . Drug use: Not Currently    Home Medications Prior to Admission medications   Medication Sig Start Date End Date Taking? Authorizing Provider  aspirin EC 81 MG tablet Take 81 mg by mouth daily.    [provider]    Allergies    Augmentin [amoxicillin-pot clavulanate], Omnicef [cefdinir],  Penicillins, and Zithromax [azithromycin]  Review of Systems   Review of Systems  Constitutional: Negative for appetite change, diaphoresis, fatigue, fever and unexpected weight change.  HENT: Negative for mouth sores.   Eyes: Negative for visual disturbance.  Respiratory: Negative for cough, chest tightness, shortness of breath and wheezing.   Cardiovascular: Negative for chest pain.  Gastrointestinal: Positive for abdominal pain, diarrhea and nausea. Negative for constipation and vomiting.  Endocrine: Negative for polydipsia, polyphagia and polyuria.  Genitourinary: Positive for vaginal discharge. Negative for dysuria, frequency, hematuria and urgency.       Breast tenderness  Musculoskeletal: Negative for back pain and neck stiffness.  Skin: Negative for rash.  Allergic/Immunologic: Negative for immunocompromised state.  Neurological: Negative for syncope, light-headedness and headaches.  Hematological: Does not bruise/bleed easily.  Psychiatric/Behavioral: Negative for sleep disturbance. The patient is not nervous/anxious.     Physical Exam Updated Vital Signs BP 129/74   Pulse 89   Temp 98.3 F (36.8 C) (Oral)   Resp 14   Ht 5' (1.524 m)   Wt 54.4 kg   SpO2 98%   BMI 23.44 kg/m   Physical Exam Vitals and nursing note reviewed.  Constitutional:      General: She is not in acute distress.    Appearance: She is not diaphoretic.  HENT:     Head: Normocephalic.  Eyes:     General: No scleral icterus.    Conjunctiva/sclera: Conjunctivae normal.  Cardiovascular:  Rate and Rhythm: Normal rate and regular rhythm.     Pulses: Normal pulses.          Radial pulses are 2+ on the right side and 2+ on the left side.  Pulmonary:     Effort: No tachypnea, accessory muscle usage, prolonged expiration, respiratory distress or retractions.     Breath sounds: No stridor.     Comments: Equal chest rise. No increased work of breathing. Abdominal:     General: There is no  distension.     Palpations: Abdomen is soft.     Tenderness: There is abdominal tenderness in the right lower quadrant, suprapubic area and left lower quadrant. There is no right CVA tenderness, left CVA tenderness, guarding or rebound.  Musculoskeletal:     Cervical back: Normal range of motion.     Comments: Moves all extremities equally and without difficulty.  Skin:    General: Skin is warm and dry.     Capillary Refill: Capillary refill takes less than 2 seconds.  Neurological:     Mental Status: She is alert.     GCS: GCS eye subscore is 4. GCS verbal subscore is 5. GCS motor subscore is 6.     Comments: Speech is clear and goal oriented.  Psychiatric:        Mood and Affect: Mood normal.     ED Results / Procedures / Treatments   Labs (all labs ordered are listed, but only abnormal results are displayed) Labs Reviewed  COMPREHENSIVE METABOLIC PANEL - Abnormal; Notable for the following components:      Result Value   CO2 21 (*)    AST 14 (*)    All other components within normal limits  HCG, QUANTITATIVE, PREGNANCY - Abnormal; Notable for the following components:   hCG, Beta Chain, Quant, S 483 (*)    All other components within normal limits  CBC  URINALYSIS, ROUTINE W REFLEX MICROSCOPIC  I-STAT BETA HCG BLOOD, ED ([facility], [facility], [facility] ONLY)  POC SARS CORONAVIRUS 2 AG -  ED    Radiology US OB Comp < 14 Wks  Result Date: 08/14/2019 CLINICAL DATA:  Initial evaluation for acute lower abdominal pain, early pregnancy. EXAM: OBSTETRIC <14 WK [place] AND TRANSVAGINAL OB US TECHNIQUE: Both transabdominal and transvaginal ultrasound examinations were performed for complete evaluation of the gestation as well as the maternal uterus, adnexal regions, and pelvic cul-de-sac. Transvaginal technique was performed to assess early pregnancy. COMPARISON:  None available. FINDINGS: Intrauterine gestational sac: Negative. Endometrial stripe measures 6.4 mm in thickness. Yolk sac:  Negative. Embryo:   Negative. Cardiac Activity: Negative. Heart Rate: N/A  bpm Subchorionic hemorrhage:  None visualized. Maternal uterus/adnexae: Ovaries are normal in appearance bilaterally. Small degenerating corpus luteal cyst noted within the right ovary. Associated small volume free fluid within the pelvis. IMPRESSION: 1. Early pregnancy with no discrete IUP or adnexal mass identified. Finding is consistent with a pregnancy of unknown anatomic location. Differential considerations include IUP to early to visualize, recent SAB, or possibly occult ectopic pregnancy. Close clinical monitoring with serial beta HCGs and close interval follow-up ultrasound recommended as clinically warranted. 2. Small degenerating right ovarian corpus luteal cyst with associated small volume free fluid within the pelvis. Findings could contribute acute lower abdominal/pelvic pain. 3. No other acute maternal uterine or adnexal abnormality identified. Electronically Signed   By: Rise Mu M.D.   On: 08/14/2019 03:01   US OB Transvaginal  Result Date: 08/14/2019 CLINICAL DATA:  Initial evaluation for  acute lower abdominal pain, early pregnancy. EXAM: OBSTETRIC <14 WK [place] AND TRANSVAGINAL OB US TECHNIQUE: Both transabdominal and transvaginal ultrasound examinations were performed for complete evaluation of the gestation as well as the maternal uterus, adnexal regions, and pelvic cul-de-sac. Transvaginal technique was performed to assess early pregnancy. COMPARISON:  None available. FINDINGS: Intrauterine gestational sac: Negative. Endometrial stripe measures 6.4 mm in thickness. Yolk sac:  Negative. Embryo:  Negative. Cardiac Activity: Negative. Heart Rate: N/A  bpm Subchorionic hemorrhage:  None visualized. Maternal uterus/adnexae: Ovaries are normal in appearance bilaterally. Small degenerating corpus luteal cyst noted within the right ovary. Associated small volume free fluid within the pelvis. IMPRESSION: 1. Early pregnancy with no  discrete IUP or adnexal mass identified. Finding is consistent with a pregnancy of unknown anatomic location. Differential considerations include IUP to early to visualize, recent SAB, or possibly occult ectopic pregnancy. Close clinical monitoring with serial beta HCGs and close interval follow-up ultrasound recommended as clinically warranted. 2. Small degenerating right ovarian corpus luteal cyst with associated small volume free fluid within the pelvis. Findings could contribute acute lower abdominal/pelvic pain. 3. No other acute maternal uterine or adnexal abnormality identified. Electronically Signed   By: Rise Mu M.D.   On: 08/14/2019 03:01    Procedures Procedures (including critical care time)  Medications Ordered in ED Medications  sodium chloride flush (NS) 0.9 % injection 3 mL (has no administration in time range)    ED Course  I have reviewed the triage vital signs and the nursing notes.  Pertinent labs & imaging results that were available during my care of the patient were reviewed by me and considered in my medical decision making (see chart for details).    MDM Rules/Calculators/A&P                       Patient presents with lower abdominal pain, breast tenderness, nausea and loose stools today.  Pregnancy test positive.  UA with evidence of urinary tract infection.  Patient without vaginal bleeding.  Patient refuses pelvic exam stating she has had pelvic pain since the birth of her previous child.  Will obtain ultrasound to evaluate for potential ectopic pregnancy.  Patient is O+. Patient is well-appearing, well-hydrated with stable vital signs.   3:18 AM Ultrasound without identified IUP.  Pregnancy of unknown location.  Claire Christensen is low therefore suspect early pregnancy versus ectopic.  A large volume free fluid in the pelvis.  Patient has remained stable throughout her time here in the emergency department.  On repeat exam her abdomen remained soft without  rebound or guarding.  She will need 48-hour follow-up with women's outpatient clinic.  Also discussed reasons to return to the emergency department.  Patient is understanding and is in agreement with the plan.   Final Clinical Impression(s) / ED Diagnoses Final diagnoses:  Lower abdominal pain  Pregnancy, unspecified gestational age    Rx / DC Orders ED Discharge Orders    None       Ethel Veronica, Boyd Kerbs 08/14/19 0319    Palumbo, April, MD 08/14/19 [phone]

## 2019-08-14 NOTE — ED Notes (Signed)
Pt reports having stopped her depo injection 5 months ago, has not had a cycle since.  Pt also reports her breasts are sore and swollen.  Pt to Ultra-sound

## 2019-08-14 NOTE — ED Notes (Signed)
Spoke to [employer] in Triage he is going to change order to be run in the main lab

## 2019-08-14 NOTE — Discharge Instructions (Addendum)
1. Medications: Prenatal vitamin, usual home medications 2. Treatment: rest, drink plenty of fluids, eat frequent small meals 3. Follow Up: Please followup with women's outpatient clinic for repeat blood work in 48 hours; please return to the ER for worsening abdominal pain, heavy vaginal bleeding, high fevers or other concerns.

## 2019-08-15 ENCOUNTER — Other Ambulatory Visit: Payer: Self-pay

## 2019-08-15 ENCOUNTER — Inpatient Hospital Stay (EMERGENCY_DEPARTMENT_HOSPITAL)
Admission: AD | Admit: 2019-08-15 | Discharge: 2019-08-15 | Disposition: A | Discharge status: Home / Self Care | Payer: Medicaid Other | Source: Home / Self Care | Attending: Obstetrics and Gynecology | Admitting: Obstetrics and Gynecology

## 2019-08-15 ENCOUNTER — Encounter (HOSPITAL_COMMUNITY): Payer: Self-pay | Admitting: Emergency Medicine

## 2019-08-15 ENCOUNTER — Encounter (HOSPITAL_COMMUNITY): Payer: Self-pay | Admitting: Obstetrics and Gynecology

## 2019-08-15 DIAGNOSIS — O3680X0 Pregnancy with inconclusive fetal viability, not applicable or unspecified: Secondary | ICD-10-CM

## 2019-08-15 DIAGNOSIS — O26891 Other specified pregnancy related conditions, first trimester: Secondary | ICD-10-CM | POA: Insufficient documentation

## 2019-08-15 DIAGNOSIS — Z87891 Personal history of nicotine dependence: Secondary | ICD-10-CM | POA: Insufficient documentation

## 2019-08-15 DIAGNOSIS — Z880 Allergy status to penicillin: Secondary | ICD-10-CM | POA: Insufficient documentation

## 2019-08-15 DIAGNOSIS — Z3A00 Weeks of gestation of pregnancy not specified: Secondary | ICD-10-CM | POA: Insufficient documentation

## 2019-08-15 DIAGNOSIS — Z881 Allergy status to other antibiotic agents status: Secondary | ICD-10-CM | POA: Diagnosis not present

## 2019-08-15 DIAGNOSIS — Z951 Presence of aortocoronary bypass graft: Secondary | ICD-10-CM | POA: Insufficient documentation

## 2019-08-15 DIAGNOSIS — R109 Unspecified abdominal pain: Secondary | ICD-10-CM | POA: Insufficient documentation

## 2019-08-15 DIAGNOSIS — Z3A01 Less than 8 weeks gestation of pregnancy: Secondary | ICD-10-CM | POA: Insufficient documentation

## 2019-08-15 LAB — URINALYSIS, ROUTINE W REFLEX MICROSCOPIC
Bilirubin Urine: NEGATIVE
Glucose, UA: NEGATIVE mg/dL
Hgb urine dipstick: NEGATIVE
Ketones, ur: NEGATIVE mg/dL
Leukocytes,Ua: NEGATIVE
Nitrite: NEGATIVE
Protein, ur: NEGATIVE mg/dL
Specific Gravity, Urine: 1.032 — ABNORMAL HIGH (ref 1.005–1.030)
pH: 5 (ref 5.0–8.0)

## 2019-08-15 LAB — HCG, QUANTITATIVE, PREGNANCY: hCG, Beta Chain, Quant, S: 1145 m[IU]/mL — ABNORMAL HIGH (ref <5)

## 2019-08-15 NOTE — Discharge Instructions (Signed)
We still cannot rule out ectopic pregnancy, but it is more unlikely since your HCG level had such a good rise. As discussed, the HCG level will at minimum increase by double in a normal pregnancy. You have been provided with information on ectopic pregnancy, in case you start to have any symptoms of an ectopic pregnancy.  Safe Medications in Pregnancy   Acne: Benzoyl Peroxide Salicylic Acid  Backache/Headache: Tylenol: 2 regular strength every 4 hours OR              2 Extra strength every 6 hours  Colds/Coughs/Allergies: Benadryl (alcohol free) 25 mg every 6 hours as needed Breath right strips Claritin Cepacol throat lozenges Chloraseptic throat spray Cold-Eeze- up to three times per day Cough drops, alcohol free Flonase (by prescription only) Guaifenesin Mucinex Robitussin DM (plain only, alcohol free) Saline nasal spray/drops Sudafed (pseudoephedrine) & Actifed ** use only after [redacted] weeks gestation and if you do not have high blood pressure Tylenol Vicks Vaporub Zinc lozenges Zyrtec   Constipation: Colace Ducolax suppositories Fleet enema Glycerin suppositories Metamucil Milk of magnesia Miralax Senokot Smooth move tea  Diarrhea: Kaopectate Imodium A-D  *NO pepto Bismol  Hemorrhoids: Anusol Anusol HC Preparation H Tucks  Indigestion: Tums Maalox Mylanta Zantac  Pepcid  Insomnia: Benadryl (alcohol free) 25mg  every 6 hours as needed Tylenol PM Unisom, no Gelcaps  Leg Cramps: Tums MagGel  Nausea/Vomiting:  Bonine Dramamine Emetrol Ginger extract Sea bands Meclizine  Nausea medication to take during pregnancy:  Unisom (doxylamine succinate 25 mg tablets) Take one tablet daily at bedtime. If symptoms are not adequately controlled, the dose can be increased to a maximum recommended dose of two tablets daily (1/2 tablet in the morning, 1/2 tablet mid-afternoon and one at bedtime). Vitamin B6 100mg  tablets. Take one tablet twice a day (up to  200 mg per day).  Skin Rashes: Aveeno products Benadryl cream or 25mg  every 6 hours as needed Calamine Lotion 1% cortisone cream  Yeast infection: Gyne-lotrimin 7 Monistat 7   **If taking multiple medications, please check labels to avoid duplicating the same active ingredients **take medication as directed on the label ** Do not exceed 4000 mg of tylenol in 24 hours **Do not take medications that contain aspirin or ibuprofen      [facility] Prenatal Care Providers          [facility] for [facility] locations:  Hours may vary. Please call for an appointment  Center for [facility] @ [place]  [address] [place]  [phone]  [facility] for [facility] @ [facility]   [address]  [phone]  [facility] @ [facility]       [address] [phone]            Center for [facility] @ [place]     [phone]          Center for [facility] Healthcare @ [facility]   [address] [phone]  Center for [facility] @ [facility]  [address] [phone]     Center for [facility] @ [facility]  [address]   [phone]

## 2019-08-15 NOTE — ED Triage Notes (Signed)
Pt states that she presented to [facility] ER yesterday due to nauseated & cramping.  Pt went to MAU today and had blood drawn.  Pt is here for results.

## 2019-08-15 NOTE — MAU Provider Note (Signed)
Chief Complaint: Abdominal Pain   First Provider Initiated Contact with Patient 08/15/19 1419      SUBJECTIVE HPI: [name] is a 19 y.o. G2P1001 who presents to MAU for repeat HCG level. She was seen in [facility] 2 days and her HCG level was 483. She has a pregnancy of unknown location. She has no new complaints today.  Past Medical History:  Diagnosis Date  . Tetralogy of Fallot    Past Surgical History:  Procedure Laterality Date  . CORONARY ARTERY BYPASS GRAFT     Social History   Socioeconomic History  . Marital status: Single    Spouse name: Not on file  . Number of children: Not on file  . Years of education: Not on file  . Highest education level: Not on file  Occupational History  . Not on file  Tobacco Use  . Smoking status: Former Smoker    Types: Cigarettes  . Smokeless tobacco: Never Used  Substance and Sexual Activity  . Alcohol use: Not Currently  . Drug use: Not Currently  . Sexual activity: Yes  Other Topics Concern  . Not on file  Social History Narrative  . Not on file   Social Determinants of Health   Financial Resource Strain:   . Difficulty of Paying Living Expenses: Not on file  Food Insecurity:   . Worried About Programme researcher, broadcasting/film/video in the Last Year: Not on file  . Ran Out of Food in the Last Year: Not on file  Transportation Needs:   . Lack of Transportation (Medical): Not on file  . Lack of Transportation (Non-Medical): Not on file  Physical Activity:   . Days of Exercise per Week: Not on file  . Minutes of Exercise per Session: Not on file  Stress:   . Feeling of Stress : Not on file  Social Connections:   . Frequency of Communication with Friends and Family: Not on file  . Frequency of Social Gatherings with Friends and Family: Not on file  . Attends Religious Services: Not on file  . Active Member of Clubs or Organizations: Not on file  . Attends Banker Meetings: Not on file  . Marital Status: Not on file   Intimate Partner Violence:   . Fear of Current or Ex-Partner: Not on file  . Emotionally Abused: Not on file  . Physically Abused: Not on file  . Sexually Abused: Not on file   No current facility-administered medications on file prior to encounter.   Current Outpatient Medications on File Prior to Encounter  Medication Sig Dispense Refill  . aspirin EC 81 MG tablet Take 81 mg by mouth daily.     Allergies  Allergen Reactions  . Augmentin [Amoxicillin-Pot Clavulanate] Hives  . Omnicef [Cefdinir] Hives  . Penicillins Hives    Did it involve swelling of the face/tongue/throat, SOB, or low BP? Yes Did it involve sudden or severe rash/hives, skin peeling, or any reaction on the inside of your mouth or nose? No Did you need to seek medical attention at a hospital or doctor's office? Yes When did it last happen?pt was a child If all above answers are "NO", may proceed with cephalosporin use.   Marland Kitchen Zithromax [Azithromycin] Hives    ROS:  Review of Systems  Constitutional: Negative.   HENT: Negative.   Eyes: Negative.   Respiratory: Negative.   Cardiovascular: Negative.   Gastrointestinal: Negative.   Endocrine: Negative.   Genitourinary: Positive for pelvic pain.  Musculoskeletal: Negative.   Skin: Negative.   Allergic/Immunologic: Negative.   Neurological: Negative.   Hematological: Negative.   Psychiatric/Behavioral: Negative.     I have reviewed patient's Past Medical Hx, Surgical Hx, Family Hx, Social Hx, medications and allergies.   Physical Exam   Patient Vitals for the past 24 hrs:  BP Temp Temp src Pulse Resp SpO2 Height Weight  08/15/19 1625 105/61 -- -- -- -- -- -- --  08/15/19 1311 109/67 98.5 F (36.9 C) Oral 93 20 100 % -- --  08/15/19 1301 -- -- -- -- -- -- 5' (1.524 m) 57 kg   Physical Exam  Nursing note and vitals reviewed. Constitutional: She is oriented to person, place, and time. She appears well-developed and well-nourished.  HENT:  Head:  Normocephalic and atraumatic.  Eyes: Pupils are equal, round, and reactive to light.  Cardiovascular: Normal rate.  Respiratory: Effort normal.  GI: Soft.  Genitourinary:    Genitourinary Comments: Pelvic deferred   Musculoskeletal:        General: Normal range of motion.     Cervical back: Normal range of motion.  Neurological: She is alert and oriented to person, place, and time.  Skin: Skin is warm and dry.  Psychiatric: She has a normal mood and affect. Her behavior is normal. Judgment and thought content normal.   Results for orders placed or performed during the hospital encounter of 08/15/19 (from the past 72 hour(s))  Urinalysis, Routine w reflex microscopic     Status: Abnormal   Collection Time: 08/15/19  1:31 PM  Result Value Ref Range   Color, Urine YELLOW YELLOW   APPearance HAZY (A) CLEAR   Specific Gravity, Urine 1.032 (H) 1.005 - 1.030   pH 5.0 5.0 - 8.0   Glucose, UA NEGATIVE NEGATIVE mg/dL   Hgb urine dipstick NEGATIVE NEGATIVE   Bilirubin Urine NEGATIVE NEGATIVE   Ketones, ur NEGATIVE NEGATIVE mg/dL   Protein, ur NEGATIVE NEGATIVE mg/dL   Nitrite NEGATIVE NEGATIVE   Leukocytes,Ua NEGATIVE NEGATIVE    Comment: Performed at [facility], [address], [place], [place] [address]  hCG, quantitative, pregnancy     Status: Abnormal   Collection Time: 08/15/19  2:20 PM  Result Value Ref Range   hCG, Beta Chain, Quant, S 1,145 (H) <5 mIU/mL    Comment:          GEST. AGE      CONC.  (mIU/mL)   <=1 WEEK        5 - 50     2 WEEKS       50 - 500     3 WEEKS       100 - 10,000     4 WEEKS     1,000 - 30,000     5 WEEKS     3,500 - 115,000   6-8 WEEKS     12,000 - 270,000    12 WEEKS     15,000 - 220,000        FEMALE AND NON-PREGNANT FEMALE:     LESS THAN 5 mIU/mL Performed at [facility], [address] [place], [place] [address]     MDM CCUA HCG Patient denies any concerning symptoms in need of emergent evaluation. Advised that U/S will be  ordered for 2 weeks. Advised to start [facility]   ASSESSMENT MSE Complete -- patient stable  PLAN Discharge patient  F/U OB U/S < 14 wks at [facility],  Aamina Skiff, CNM 08/15/2019 9:26 PM

## 2019-08-15 NOTE — MAU Note (Signed)
Presents with c/o abdominal cramping, no VB.  Seen in ED 2 days ago for same, instructed to return for blood work.  Unsure LMP.

## 2019-08-16 ENCOUNTER — Emergency Department (HOSPITAL_COMMUNITY)
Admission: EM | Admit: 2019-08-16 | Discharge: 2019-08-16 | Disposition: A | Discharge status: Home / Self Care | Payer: Medicaid Other | Attending: Emergency Medicine | Admitting: Emergency Medicine

## 2019-08-16 DIAGNOSIS — Z3491 Encounter for supervision of normal pregnancy, unspecified, first trimester: Secondary | ICD-10-CM

## 2019-08-16 DIAGNOSIS — R110 Nausea: Secondary | ICD-10-CM

## 2019-08-16 DIAGNOSIS — O26899 Other specified pregnancy related conditions, unspecified trimester: Secondary | ICD-10-CM

## 2019-08-16 MED ORDER — METOCLOPRAMIDE HCL 10 MG PO TABS: 10.0000 mg | ORAL_TABLET | Freq: Four times a day (QID) | ORAL | 0 refills | Status: DC | PRN

## 2019-08-16 MED ORDER — PRENATAL COMPLETE 14-0.4 MG PO TABS: 1.0000 | ORAL_TABLET | Freq: Every day | ORAL | 0 refills | Status: AC

## 2019-08-16 NOTE — ED Provider Notes (Signed)
[facility]-EMERGENCY DEPT Provider Note: [name], MD, FACEP  CSN: [id number] MRN: [id number] ARRIVAL: 08/15/19 at 2316 ROOM: WA21/WA21   CHIEF COMPLAINT  Abdominal Pain   HISTORY OF PRESENT ILLNESS  08/16/19 12:39 AM Claire Christensen is a 19 y.o. female who was seen at [facility] on 08/13/2019 for abdominal cramping and concerned she was pregnant.  She was found to be pregnant and an ultrasound showed an IUP in about 6 weeks.  She states she left MAU because the staff was rude to her as she is here wanting the results of her beta-hCG testing.  Quant beta hCG 3 days ago was 43 and was 1145 yesterday.  She continues to have abdominal cramping but denies vaginal bleeding.  She is having nausea but no vomiting.  She rates her abdominal cramps as 5 out of 10.   Past Medical History:  Diagnosis Date  . Tetralogy of Fallot     Past Surgical History:  Procedure Laterality Date  . CORONARY ARTERY BYPASS GRAFT      Family History  Problem Relation Age of Onset  . Heart disease Mother   . Thalassemia Father     Social History   Tobacco Use  . Smoking status: Former Smoker    Types: Cigarettes  . Smokeless tobacco: Never Used  Substance Use Topics  . Alcohol use: Not Currently  . Drug use: Not Currently    Prior to Admission medications   Medication Sig Start Date End Date Taking? Authorizing Provider  metoCLOPramide (REGLAN) 10 MG tablet Take 1 tablet (10 mg total) by mouth every 6 (six) hours as needed for nausea or vomiting (nausea/headache). 08/16/19   Avaeh Ewer, MD  Prenatal Vit-Fe Fumarate-FA (PRENATAL COMPLETE) 14-0.4 MG TABS Take 1 tablet by mouth daily. 08/16/19   Lindzee Gouge, MD    Allergies Augmentin [amoxicillin-pot clavulanate], Omnicef [cefdinir], Penicillins, and Zithromax [azithromycin]   REVIEW OF SYSTEMS  Negative except as noted here or in the History of Present Illness.   PHYSICAL EXAMINATION  Initial Vital Signs Blood pressure (!) 109/57,  pulse 86, temperature 98.1 F (36.7 C), temperature source Oral, resp. rate 14, height 5' (1.524 m), weight 56.7 kg, SpO2 100 %.  Examination General: Well-developed, well-nourished female in no acute distress; appearance consistent with age of record HENT: normocephalic; atraumatic Eyes: pupils equal, round and reactive to light; extraocular muscles intact Neck: supple Heart: regular rate and rhythm; murmur, loudest at left upper sternal border Lungs: clear to auscultation bilaterally Abdomen: soft; nondistended; nontender; bowel sounds present Extremities: No deformity; full range of motion; pulses normal Neurologic: Awake, alert and oriented; motor function intact in all extremities and symmetric; no facial droop Skin: Warm and dry Psychiatric: Normal mood and affect   RESULTS  Summary of this visit's results, reviewed and interpreted by myself:   EKG Interpretation  Date/Time:    Ventricular Rate:    PR Interval:    QRS Duration:   QT Interval:    QTC Calculation:   R Axis:     Text Interpretation:        Laboratory Studies: Results for orders placed or performed during the hospital encounter of 08/15/19 (from the past 24 hour(s))  Urinalysis, Routine w reflex microscopic     Status: Abnormal   Collection Time: 08/15/19  1:31 PM  Result Value Ref Range   Color, Urine YELLOW YELLOW   APPearance HAZY (A) CLEAR   Specific Gravity, Urine 1.032 (H) 1.005 - 1.030   pH 5.0  5.0 - 8.0   Glucose, UA NEGATIVE NEGATIVE mg/dL   Hgb urine dipstick NEGATIVE NEGATIVE   Bilirubin Urine NEGATIVE NEGATIVE   Ketones, ur NEGATIVE NEGATIVE mg/dL   Protein, ur NEGATIVE NEGATIVE mg/dL   Nitrite NEGATIVE NEGATIVE   Leukocytes,Ua NEGATIVE NEGATIVE  hCG, quantitative, pregnancy     Status: Abnormal   Collection Time: 08/15/19  2:20 PM  Result Value Ref Range   hCG, Beta Chain, Quant, S 1,145 (H) <5 mIU/mL   Imaging Studies: US OB Comp < 14 Wks  Result Date: 08/14/2019 CLINICAL DATA:   Initial evaluation for acute lower abdominal pain, early pregnancy. EXAM: OBSTETRIC <14 WK [place] AND TRANSVAGINAL OB US TECHNIQUE: Both transabdominal and transvaginal ultrasound examinations were performed for complete evaluation of the gestation as well as the maternal uterus, adnexal regions, and pelvic cul-de-sac. Transvaginal technique was performed to assess early pregnancy. COMPARISON:  None available. FINDINGS: Intrauterine gestational sac: Negative. Endometrial stripe measures 6.4 mm in thickness. Yolk sac:  Negative. Embryo:  Negative. Cardiac Activity: Negative. Heart Rate: N/A  bpm Subchorionic hemorrhage:  None visualized. Maternal uterus/adnexae: Ovaries are normal in appearance bilaterally. Small degenerating corpus luteal cyst noted within the right ovary. Associated small volume free fluid within the pelvis. IMPRESSION: 1. Early pregnancy with no discrete IUP or adnexal mass identified. Finding is consistent with a pregnancy of unknown anatomic location. Differential considerations include IUP to early to visualize, recent SAB, or possibly occult ectopic pregnancy. Close clinical monitoring with serial beta HCGs and close interval follow-up ultrasound recommended as clinically warranted. 2. Small degenerating right ovarian corpus luteal cyst with associated small volume free fluid within the pelvis. Findings could contribute acute lower abdominal/pelvic pain. 3. No other acute maternal uterine or adnexal abnormality identified. Electronically Signed   By: Jeannine Boga M.D.   On: 08/14/2019 03:01   US OB Transvaginal  Result Date: 08/14/2019 CLINICAL DATA:  Initial evaluation for acute lower abdominal pain, early pregnancy. EXAM: OBSTETRIC <14 WK [place] AND TRANSVAGINAL OB US TECHNIQUE: Both transabdominal and transvaginal ultrasound examinations were performed for complete evaluation of the gestation as well as the maternal uterus, adnexal regions, and pelvic cul-de-sac. Transvaginal technique  was performed to assess early pregnancy. COMPARISON:  None available. FINDINGS: Intrauterine gestational sac: Negative. Endometrial stripe measures 6.4 mm in thickness. Yolk sac:  Negative. Embryo:  Negative. Cardiac Activity: Negative. Heart Rate: N/A  bpm Subchorionic hemorrhage:  None visualized. Maternal uterus/adnexae: Ovaries are normal in appearance bilaterally. Small degenerating corpus luteal cyst noted within the right ovary. Associated small volume free fluid within the pelvis. IMPRESSION: 1. Early pregnancy with no discrete IUP or adnexal mass identified. Finding is consistent with a pregnancy of unknown anatomic location. Differential considerations include IUP to early to visualize, recent SAB, or possibly occult ectopic pregnancy. Close clinical monitoring with serial beta HCGs and close interval follow-up ultrasound recommended as clinically warranted. 2. Small degenerating right ovarian corpus luteal cyst with associated small volume free fluid within the pelvis. Findings could contribute acute lower abdominal/pelvic pain. 3. No other acute maternal uterine or adnexal abnormality identified. Electronically Signed   By: Jeannine Boga M.D.   On: 08/14/2019 03:01    ED COURSE and MDM  Nursing notes, initial and subsequent vitals signs, including pulse oximetry, reviewed and interpreted by myself.  Vitals:   08/15/19 2339  BP: (!) 109/57  Pulse: 86  Resp: 14  Temp: 98.1 F (36.7 C)  TempSrc: Oral  SpO2: 100%  Weight: 56.7 kg  Height: 5' (  1.524 m)   Medications - No data to display  Patient advised of ultrasound and quant beta hCG findings.  These are reassuring for viable pregnancy.  She is requesting referral to an OB/GYN and will refer to the teaching service.  PROCEDURES  Procedures   ED DIAGNOSES     ICD-10-CM   1. Normal IUP (intrauterine pregnancy) on prenatal ultrasound, first trimester  Z34.91   2. Pregnancy related nausea, antepartum  O26.899    R11.0         Tyyne Cliett, Jonny Ruiz, MD 08/16/19 [phone]

## 2019-08-22 ENCOUNTER — Emergency Department (HOSPITAL_COMMUNITY): Payer: Medicaid Other

## 2019-08-22 ENCOUNTER — Emergency Department (HOSPITAL_COMMUNITY)
Admission: EM | Admit: 2019-08-22 | Discharge: 2019-08-22 | Disposition: A | Discharge status: Home / Self Care | Payer: Medicaid Other | Attending: Emergency Medicine | Admitting: Emergency Medicine

## 2019-08-22 ENCOUNTER — Encounter (HOSPITAL_COMMUNITY): Payer: Self-pay | Admitting: Emergency Medicine

## 2019-08-22 DIAGNOSIS — O209 Hemorrhage in early pregnancy, unspecified: Secondary | ICD-10-CM | POA: Insufficient documentation

## 2019-08-22 DIAGNOSIS — R1030 Lower abdominal pain, unspecified: Secondary | ICD-10-CM | POA: Insufficient documentation

## 2019-08-22 DIAGNOSIS — Z3A01 Less than 8 weeks gestation of pregnancy: Secondary | ICD-10-CM | POA: Diagnosis not present

## 2019-08-22 DIAGNOSIS — N939 Abnormal uterine and vaginal bleeding, unspecified: Secondary | ICD-10-CM

## 2019-08-22 LAB — BASIC METABOLIC PANEL
Anion gap: 5 (ref 5–15)
BUN: 7 mg/dL (ref 6–20)
CO2: 24 mmol/L (ref 22–32)
Calcium: 9.1 mg/dL (ref 8.9–10.3)
Chloride: 108 mmol/L (ref 98–111)
Creatinine, Ser: 0.61 mg/dL (ref 0.44–1.00)
GFR calc Af Amer: >60 mL/min (ref >60)
GFR calc non Af Amer: >60 mL/min (ref >60)
Glucose, Bld: 87 mg/dL (ref 70–99)
Potassium: 3.3 mmol/L — ABNORMAL LOW (ref 3.5–5.1)
Sodium: 137 mmol/L (ref 135–145)

## 2019-08-22 LAB — HIV ANTIBODY (ROUTINE TESTING W REFLEX): HIV Screen 4th Generation wRfx: NONREACTIVE

## 2019-08-22 LAB — WET PREP, GENITAL
Clue Cells Wet Prep HPF POC: NONE SEEN
Sperm: NONE SEEN
Trich, Wet Prep: NONE SEEN
WBC, Wet Prep HPF POC: NONE SEEN
Yeast Wet Prep HPF POC: NONE SEEN

## 2019-08-22 LAB — HCG, QUANTITATIVE, PREGNANCY: hCG, Beta Chain, Quant, S: 17183 m[IU]/mL — ABNORMAL HIGH (ref <5)

## 2019-08-22 LAB — CBC WITH DIFFERENTIAL/PLATELET
Abs Immature Granulocytes: 0.03 10*3/uL (ref 0.00–0.07)
Basophils Absolute: 0.1 10*3/uL (ref 0.0–0.1)
Basophils Relative: 1 %
Eosinophils Absolute: 0.1 10*3/uL (ref 0.0–0.5)
Eosinophils Relative: 1 %
HCT: 35.4 % — ABNORMAL LOW (ref 36.0–46.0)
Hemoglobin: 11.6 g/dL — ABNORMAL LOW (ref 12.0–15.0)
Immature Granulocytes: 0 %
Lymphocytes Relative: 37 %
Lymphs Abs: 3.4 10*3/uL (ref 0.7–4.0)
MCH: 26.4 pg (ref 26.0–34.0)
MCHC: 32.8 g/dL (ref 30.0–36.0)
MCV: 80.6 fL (ref 80.0–100.0)
Monocytes Absolute: 0.7 10*3/uL (ref 0.1–1.0)
Monocytes Relative: 7 %
Neutro Abs: 4.9 10*3/uL (ref 1.7–7.7)
Neutrophils Relative %: 54 %
Platelets: 321 10*3/uL (ref 150–400)
RBC: 4.39 MIL/uL (ref 3.87–5.11)
RDW: 14.2 % (ref 11.5–15.5)
WBC: 9.1 10*3/uL (ref 4.0–10.5)
nRBC: 0 % (ref 0.0–0.2)

## 2019-08-22 LAB — URINALYSIS, ROUTINE W REFLEX MICROSCOPIC
Bilirubin Urine: NEGATIVE
Glucose, UA: NEGATIVE mg/dL
Hgb urine dipstick: NEGATIVE
Ketones, ur: NEGATIVE mg/dL
Leukocytes,Ua: NEGATIVE
Nitrite: NEGATIVE
Protein, ur: NEGATIVE mg/dL
Specific Gravity, Urine: 1.008 (ref 1.005–1.030)
pH: 8 (ref 5.0–8.0)

## 2019-08-22 LAB — RPR: RPR Ser Ql: NONREACTIVE

## 2019-08-22 LAB — ABO/RH: ABO/RH(D): O POS

## 2019-08-22 NOTE — Discharge Instructions (Addendum)
Get help right away if: You have heavy vaginal bleeding. You have blood clots coming from your vagina. You pass tissue from your vagina. You leak fluid, or you have a gush of fluid from your vagina. You have severe low back pain or abdominal cramps. You have fever, chills, and severe abdominal pain.

## 2019-08-22 NOTE — ED Provider Notes (Signed)
[facility]-EMERGENCY DEPT Provider Note   CSN: [id number] Arrival date & time: 08/22/19  0020     History Chief Complaint  Patient presents with  . Vaginal Bleeding    Claire Christensen is a 19 y.o. female who presents to the ED with cc of Vaginal bleeding. She is G2P1001.  This is estimated at [redacted] weeks pregnant she states that she began having some cramping earlier and then noticed some bright red blood from her vagina.  She came to the emergency department for evaluation.  She denies any urinary symptoms, back pain, nausea, vomiting, fevers.  HPI     Past Medical History:  Diagnosis Date  . Tetralogy of Fallot     Patient Active Problem List   Diagnosis Date Noted  . Pregnancy of unknown anatomic location 08/15/2019    Past Surgical History:  Procedure Laterality Date  . CORONARY ARTERY BYPASS GRAFT       OB History    Gravida  2   Para  1   Term  1   Preterm      AB      Living  1     SAB      TAB      Ectopic      Multiple      Live Births  1           Family History  Problem Relation Age of Onset  . Heart disease Mother   . Thalassemia Father     Social History   Tobacco Use  . Smoking status: Former Smoker    Types: Cigarettes  . Smokeless tobacco: Never Used  Substance Use Topics  . Alcohol use: Not Currently  . Drug use: Not Currently    Home Medications Prior to Admission medications   Medication Sig Start Date End Date Taking? Authorizing Provider  metoCLOPramide (REGLAN) 10 MG tablet Take 1 tablet (10 mg total) by mouth every 6 (six) hours as needed for nausea or vomiting (nausea/headache). 08/16/19   Molpus, John, MD  Prenatal Vit-Fe Fumarate-FA (PRENATAL COMPLETE) 14-0.4 MG TABS Take 1 tablet by mouth daily. 08/16/19   Molpus, John, MD    Allergies    Augmentin [amoxicillin-pot clavulanate], Omnicef [cefdinir], Penicillins, and Zithromax [azithromycin]  Review of Systems   Review of Systems Ten  systems reviewed and are negative for acute change, except as noted in the HPI.   Physical Exam Updated Vital Signs BP 120/73 (BP Location: Right Arm)   Pulse 91   Temp 98.4 F (36.9 C) (Oral)   Resp 16   Ht 5' (1.524 m)   Wt 56.7 kg   LMP  (LMP Unknown)   SpO2 100%   BMI 24.41 kg/m   Physical Exam Vitals and nursing note reviewed. Exam conducted with a chaperone present.  Constitutional:      General: She is not in acute distress.    Appearance: She is well-developed. She is not diaphoretic.  HENT:     Head: Normocephalic and atraumatic.  Eyes:     General: No scleral icterus.    Conjunctiva/sclera: Conjunctivae normal.  Cardiovascular:     Rate and Rhythm: Normal rate and regular rhythm.     Heart sounds: Normal heart sounds. No murmur. No friction rub. No gallop.   Pulmonary:     Effort: Pulmonary effort is normal. No respiratory distress.     Breath sounds: Normal breath sounds.  Abdominal:     General: Bowel sounds are normal.  There is no distension.     Palpations: Abdomen is soft. There is no mass.     Tenderness: There is no abdominal tenderness. There is no guarding.  Genitourinary:    Vagina: Normal.     Cervix: Cervical bleeding present. No cervical motion tenderness.     Uterus: Normal.      Adnexa: Right adnexa normal and left adnexa normal.  Musculoskeletal:     Cervical back: Normal range of motion.  Skin:    General: Skin is warm and dry.  Neurological:     Mental Status: She is alert and oriented to person, place, and time.  Psychiatric:        Behavior: Behavior normal.     ED Results / Procedures / Treatments   Labs (all labs ordered are listed, but only abnormal results are displayed) Labs Reviewed  BASIC METABOLIC PANEL - Abnormal; Notable for the following components:      Result Value   Potassium 3.3 (*)    All other components within normal limits  CBC WITH DIFFERENTIAL/PLATELET - Abnormal; Notable for the following components:    Hemoglobin 11.6 (*)    HCT 35.4 (*)    All other components within normal limits  HCG, QUANTITATIVE, PREGNANCY - Abnormal; Notable for the following components:   hCG, Beta Chain, Quant, S 17,183 (*)    All other components within normal limits  URINALYSIS, ROUTINE W REFLEX MICROSCOPIC - Abnormal; Notable for the following components:   Color, Urine STRAW (*)    All other components within normal limits  WET PREP, GENITAL  RPR  HIV ANTIBODY (ROUTINE TESTING W REFLEX)  ABO/RH  GC/CHLAMYDIA PROBE AMP ([facility]) NOT AT [facility]    EKG None  Radiology No results found.  Procedures Procedures (including critical care time)  Medications Ordered in ED Medications - No data to display  ED Course  I have reviewed the triage vital signs and the nursing notes.  Pertinent labs & imaging results that were available during my care of the patient were reviewed by me and considered in my medical decision making (see chart for details).    MDM Rules/Calculators/A&P                      Patient here with vaginal bleeding in early pregnancy.  I personally reviewed the patient's labs which shows mild.  Hypokalemia, beta hCG is 17,183 and appropriate for estimated gestational age.  Mild anemia is Urine without infection.normocytic.  Wet prep shows no abnormality.  Recently reviewed the patient's transvaginal ultrasound which shows at least 1 gestational sac possibly 2.  I have discussed this with the patient.  Is no other significant finding as a cause of the patient's bleeding.  She is hemodynamically stable and appears appropriate for outpatient follow-up with OB/GYN. Final Clinical Impression(s) / ED Diagnoses Final diagnoses:  Vaginal bleeding    Rx / DC Orders ED Discharge Orders    None       Margarita Mail, PA-C 08/22/19 0430    Mesner, Corene Cornea, MD 08/22/19 [phone]

## 2019-08-22 NOTE — ED Triage Notes (Signed)
Patient here from home with complaints of vaginal bleeding that started 1 hour ago. [redacted] weeks pregnant.

## 2019-08-23 ENCOUNTER — Encounter (HOSPITAL_COMMUNITY): Payer: Self-pay | Admitting: Obstetrics & Gynecology

## 2019-08-23 ENCOUNTER — Telehealth: Payer: Self-pay | Admitting: Medical

## 2019-08-23 ENCOUNTER — Other Ambulatory Visit: Payer: Self-pay

## 2019-08-23 ENCOUNTER — Inpatient Hospital Stay (HOSPITAL_COMMUNITY)
Admission: AD | Admit: 2019-08-23 | Discharge: 2019-08-24 | Disposition: A | Discharge status: Home / Self Care | Payer: Medicaid Other | Attending: Obstetrics & Gynecology | Admitting: Obstetrics & Gynecology

## 2019-08-23 DIAGNOSIS — Z8774 Personal history of (corrected) congenital malformations of heart and circulatory system: Secondary | ICD-10-CM | POA: Insufficient documentation

## 2019-08-23 DIAGNOSIS — Z881 Allergy status to other antibiotic agents status: Secondary | ICD-10-CM | POA: Insufficient documentation

## 2019-08-23 DIAGNOSIS — Z3A01 Less than 8 weeks gestation of pregnancy: Secondary | ICD-10-CM | POA: Insufficient documentation

## 2019-08-23 DIAGNOSIS — O98811 Other maternal infectious and parasitic diseases complicating pregnancy, first trimester: Secondary | ICD-10-CM | POA: Insufficient documentation

## 2019-08-23 DIAGNOSIS — Z87891 Personal history of nicotine dependence: Secondary | ICD-10-CM | POA: Insufficient documentation

## 2019-08-23 DIAGNOSIS — Z880 Allergy status to penicillin: Secondary | ICD-10-CM | POA: Insufficient documentation

## 2019-08-23 DIAGNOSIS — Z889 Allergy status to unspecified drugs, medicaments and biological substances status: Secondary | ICD-10-CM

## 2019-08-23 DIAGNOSIS — A749 Chlamydial infection, unspecified: Secondary | ICD-10-CM

## 2019-08-23 DIAGNOSIS — Z951 Presence of aortocoronary bypass graft: Secondary | ICD-10-CM | POA: Insufficient documentation

## 2019-08-23 LAB — GC/CHLAMYDIA PROBE AMP (~~LOC~~) NOT AT ARMC
Chlamydia: POSITIVE — AB
Neisseria Gonorrhea: NEGATIVE

## 2019-08-23 MED ORDER — HYDROXYZINE HCL 25 MG PO TABS
25.0000 mg | ORAL_TABLET | Freq: Once | ORAL | Status: AC
  Administered 2019-08-24: 25 mg via ORAL
  Filled 2019-08-23: qty 1

## 2019-08-23 MED ORDER — LACTATED RINGERS IV SOLN: INTRAVENOUS | Status: DC

## 2019-08-23 MED ORDER — ACETAMINOPHEN 325 MG PO TABS
650.0000 mg | ORAL_TABLET | Freq: Once | ORAL | Status: AC
  Administered 2019-08-24: 650 mg via ORAL
  Filled 2019-08-23: qty 2

## 2019-08-23 MED ORDER — DIPHENHYDRAMINE HCL 50 MG/ML IJ SOLN
25.0000 mg | Freq: Once | INTRAMUSCULAR | Status: AC
  Administered 2019-08-24: 25 mg via INTRAVENOUS
  Filled 2019-08-23: qty 1

## 2019-08-23 MED ORDER — AZITHROMYCIN 1 G PO PACK
1.0000 g | PACK | Freq: Once | ORAL | Status: AC
  Administered 2019-08-24: 1 g via ORAL
  Filled 2019-08-23: qty 1

## 2019-08-23 MED ORDER — AZITHROMYCIN 250 MG PO TABS: 1000.0000 mg | ORAL_TABLET | Freq: Once | ORAL | Status: DC

## 2019-08-23 NOTE — MAU Provider Note (Signed)
Chief Complaint: Verbal Orders (antibiotic treatment)   First Provider Initiated Contact with Patient 08/23/19 2335        SUBJECTIVE HPI: Claire Christensen is a 19 y.o. G2P1001 at [redacted]w[redacted]d by LMP who presents to maternity admissions reporting need tor antibiotic for chlamydia.  She is allergic to azithromycin so was brought in for pre-med with antihistamines per PharmD consult.  She denies vaginal bleeding, vaginal itching/burning, urinary symptoms, h/a, dizziness, n/v, or fever/chills.    Other This is a new problem. The current episode started today. Pertinent negatives include no abdominal pain, fever, myalgias, nausea or weakness. Nothing aggravates the symptoms. She has tried nothing for the symptoms.   RN Note: Pt reports to MAU per PA request for antibiotic treatment. Pt reports back pain, with a sharp pain in her left side. Pt reports occasional vaginal bleeding  Past Medical History:  Diagnosis Date  . Tetralogy of Fallot    Past Surgical History:  Procedure Laterality Date  . CORONARY ARTERY BYPASS GRAFT     Social History   Socioeconomic History  . Marital status: Single    Spouse name: Not on file  . Number of children: Not on file  . Years of education: Not on file  . Highest education level: Not on file  Occupational History  . Not on file  Tobacco Use  . Smoking status: Former Smoker    Types: Cigarettes  . Smokeless tobacco: Never Used  Substance and Sexual Activity  . Alcohol use: Not Currently  . Drug use: Not Currently  . Sexual activity: Yes  Other Topics Concern  . Not on file  Social History Narrative  . Not on file   Social Determinants of Health   Financial Resource Strain:   . Difficulty of Paying Living Expenses: Not on file  Food Insecurity:   . Worried About Programme researcher, broadcasting/film/video in the Last Year: Not on file  . Ran Out of Food in the Last Year: Not on file  Transportation Needs:   . Lack of Transportation (Medical): Not on file  . Lack of  Transportation (Non-Medical): Not on file  Physical Activity:   . Days of Exercise per Week: Not on file  . Minutes of Exercise per Session: Not on file  Stress:   . Feeling of Stress : Not on file  Social Connections:   . Frequency of Communication with Friends and Family: Not on file  . Frequency of Social Gatherings with Friends and Family: Not on file  . Attends Religious Services: Not on file  . Active Member of Clubs or Organizations: Not on file  . Attends Banker Meetings: Not on file  . Marital Status: Not on file  Intimate Partner Violence:   . Fear of Current or Ex-Partner: Not on file  . Emotionally Abused: Not on file  . Physically Abused: Not on file  . Sexually Abused: Not on file   No current facility-administered medications on file prior to encounter.   Current Outpatient Medications on File Prior to Encounter  Medication Sig Dispense Refill  . Prenatal Vit-Fe Fumarate-FA (PRENATAL COMPLETE) 14-0.4 MG TABS Take 1 tablet by mouth daily. 90 tablet 0  . metoCLOPramide (REGLAN) 10 MG tablet Take 1 tablet (10 mg total) by mouth every 6 (six) hours as needed for nausea or vomiting (nausea/headache). 12 tablet 0   Allergies  Allergen Reactions  . Augmentin [Amoxicillin-Pot Clavulanate] Hives  . Omnicef [Cefdinir] Hives  . Penicillins Hives  Did it involve swelling of the face/tongue/throat, SOB, or low BP? Yes Did it involve sudden or severe rash/hives, skin peeling, or any reaction on the inside of your mouth or nose? No Did you need to seek medical attention at a hospital or doctor's office? Yes When did it last happen?pt was a child If all above answers are "NO", may proceed with cephalosporin use.   Marland Kitchen Zithromax [Azithromycin] Hives    I have reviewed patient's Past Medical Hx, Surgical Hx, Family Hx, Social Hx, medications and allergies.   ROS:  Review of Systems  Constitutional: Negative for fever.  Respiratory: Negative for shortness  of breath.   Gastrointestinal: Negative for abdominal pain and nausea.  Genitourinary: Negative for vaginal bleeding.  Musculoskeletal: Negative for myalgias.  Neurological: Negative for dizziness and weakness.   Review of Systems  Other systems negative   Physical Exam  Physical Exam Patient Vitals for the past 24 hrs:  Temp  08/23/19 2323 98 F (36.7 C)   Vitals:   08/24/19 0300 08/24/19 0310 08/24/19 0315 08/24/19 0330  BP: (!) 111/54  112/60 113/60  Pulse: 98  97 92  Resp:      Temp:      SpO2: 97% 98% 98% 98%   Constitutional: Well-developed, well-nourished female in no acute distress.  Cardiovascular: normal rate Respiratory: normal effort GI: Abd soft, non-tender. Pos BS x 4 MS: Extremities nontender, no edema, normal ROM Neurologic: Alert and oriented x 4.  GU: Neg CVAT.  PELVIC EXAM: deferred  LAB RESULTS No results found for this or any previous visit (from the past 24 hour(s)). --/--/O POS Performed at [facility], [place] [address], [place], [place] [address]  (02/09 0107)  IMAGING   MAU Management/MDM: IV started for access Benadryl given IV for pre-med Vistaril given PO so it would have a longer effect Consulted Pharmacist for contingency planning re: epinephrine dosage Zithromax 1gm given Observed patient for 2-3 hours post administration with no allergic symptoms observed Reviewed importance of making sure partner is treated so she wont need retreatment  ASSESSMENT Chlamydia infection Allergy to Zithromax Successful treatment with pre-med  PLAN Discharge home Return if any symptoms appear  Pt stable at time of discharge. Encouraged to return here or to other Urgent Care/ED if she develops worsening of symptoms, increase in pain, fever, or other concerning symptoms.    Hansel Feinstein CNM, MSN Certified Nurse-Midwife 08/23/2019  11:35 PM

## 2019-08-23 NOTE — Telephone Encounter (Addendum)
Claire Christensen tested positive for Chlamydia. Patient was called by RN and allergies and pharmacy confirmed. Per RN note patient is allergic to Azithromycin and PCN. Discussed options for treatment at length with PharmD, recommendation is for patient to come to MAU for IV Benadryl and Vistaril prior to one time dose of Zithromax for treatment in MAU. Patient should be monitored for reaction prior to discharge. Patient called and informed of plan for treatment. MAU providers notified.     Marny Lowenstein, PA-C 08/23/2019 4:32 PM       ----- Message from Kathe Becton, RN sent at 08/23/2019 11:34 AM EST ----- This patient tested positive for :  Chlamydia   She is allergic to: " Augmentin, Ominicef, Penicillin, Azithromycin.", I have informed the patient of her results and confirmed her pharmacy is correct in her chart. Please send Rx.   Thank you,   Kathe Becton, RN   Results faxed to [facility] Department.

## 2019-08-23 NOTE — MAU Note (Signed)
Pt reports to MAU per PA request for antibiotic treatment. Pt reports back pain, with a sharp pain in her left side. Pt reports occasional vaginal bleeding.

## 2019-08-24 DIAGNOSIS — Z8774 Personal history of (corrected) congenital malformations of heart and circulatory system: Secondary | ICD-10-CM | POA: Diagnosis not present

## 2019-08-24 DIAGNOSIS — A749 Chlamydial infection, unspecified: Secondary | ICD-10-CM | POA: Diagnosis not present

## 2019-08-24 DIAGNOSIS — O98811 Other maternal infectious and parasitic diseases complicating pregnancy, first trimester: Secondary | ICD-10-CM | POA: Diagnosis present

## 2019-08-24 DIAGNOSIS — Z3A10 10 weeks gestation of pregnancy: Secondary | ICD-10-CM | POA: Diagnosis not present

## 2019-08-24 DIAGNOSIS — Z881 Allergy status to other antibiotic agents status: Secondary | ICD-10-CM | POA: Diagnosis not present

## 2019-08-24 DIAGNOSIS — Z951 Presence of aortocoronary bypass graft: Secondary | ICD-10-CM | POA: Diagnosis not present

## 2019-08-24 DIAGNOSIS — Z880 Allergy status to penicillin: Secondary | ICD-10-CM | POA: Diagnosis not present

## 2019-08-24 DIAGNOSIS — Z3A01 Less than 8 weeks gestation of pregnancy: Secondary | ICD-10-CM | POA: Diagnosis not present

## 2019-08-24 DIAGNOSIS — Z87891 Personal history of nicotine dependence: Secondary | ICD-10-CM | POA: Diagnosis not present

## 2019-08-24 MED ORDER — DIPHENHYDRAMINE HCL 25 MG PO TABS: 25.0000 mg | ORAL_TABLET | Freq: Four times a day (QID) | ORAL | 0 refills | Status: AC | PRN

## 2019-08-24 MED ORDER — LACTATED RINGERS IV SOLN: INTRAVENOUS | Status: DC

## 2019-08-24 MED ORDER — ONDANSETRON HCL 4 MG PO TABS
4.0000 mg | ORAL_TABLET | Freq: Once | ORAL | Status: AC
  Administered 2019-08-24: 01:00:00 4 mg via ORAL
  Filled 2019-08-24: qty 1

## 2019-08-24 NOTE — Discharge Instructions (Signed)
Chlamydia, Female Chlamydia is an STD (sexually transmitted disease). It is a bacterial infection that spreads (is contagious) through sexual contact. Chlamydia can occur in different areas of the body, including:  The tube that moves urine from the bladder out of the body (urethra).  The lower part of the uterus (cervix).  The throat.  The rectum. This condition is not difficult to treat. However, if left untreated, chlamydia can lead to more serious health problems, including pelvic inflammatory disorder (PID). PID can increase your risk of not being able to have children (sterility). Also, if chlamydia is left untreated and you are pregnant or become pregnant, there is a chance that your baby can become infected during delivery. This may cause serious health problems for the baby. What are the causes? Chlamydia is caused by the bacteria Chlamydia trachomatis. It is passed from an infected partner during sexual activity. Chlamydia can spread through contact with the genitals, mouth, or rectum. What are the signs or symptoms? In some cases, there may not be any symptoms for this condition (asymptomatic), especially early in the infection. If symptoms develop, they may include:  Burning with urination.  Frequent urination.  Vaginal discharge.  Redness, soreness, and swelling (inflammation) of the rectum.  Bleeding or discharge from the rectum.  Abdominal pain.  Pain during sexual intercourse.  Bleeding between menstrual periods.  Itching, burning, or redness in the eyes, or discharge from the eyes. How is this diagnosed? This condition may be diagnosed with:  Urine tests.  Swab tests. Depending on your symptoms, your health care provider may use a cotton swab to collect discharge from your vagina or rectum to test for the bacteria.  A pelvic exam. How is this treated? This condition is treated with antibiotic medicines. If you are pregnant, certain types of antibiotics will  need to be avoided. Follow these instructions at home: Medicines  Take over-the-counter and prescription medicines only as told by your health care provider.  Take your antibiotic medicine as told by your health care provider. Do not stop taking the antibiotic even if you start to feel better. Sexual activity  Tell sexual partners about your infection. This includes any oral, anal, or vaginal sex partners you have had within 60 days of when your symptoms started. Sexual partners should also be treated, even if they have no signs of the disease.  Do not have sex until you and your sexual partners have completed treatment and your health care provider says it is okay. If your health care provider prescribed you a single dose treatment, wait 7 days after taking the treatment before having sex. General instructions  It is your responsibility to get your test results. Ask your health care provider, or the department performing the test, when your results will be ready.  Get plenty of rest.  Eat a healthy, well-balanced diet.  Drink enough fluids to keep your urine clear or pale yellow.  Keep all follow-up visits as told by your health care provider. This is important. You may need to be tested for infection again 3 months after treatment. How is this prevented? The only sure way to prevent chlamydia is to avoid having sex. However, you can lower your risk by:  Using latex condoms correctly every time you have sex.  Not having multiple sexual partners.  Asking if your sexual partner has been tested for STIs and had negative results. Contact a health care provider if:  You develop new symptoms or your symptoms do not get  better after completing treatment.  You have a fever or chills.  You have pain during sexual intercourse. Get help right away if:  Your pain gets worse and does not get better with medicine.  You develop flu-like symptoms, such as night sweats, sore throat, or  muscle aches.  You experience nausea or vomiting.  You have difficulty swallowing.  You have bleeding between periods or after sex.  You have irregular menstrual periods.  You have abdominal or lower back pain that does not get better with medicine.  You feel weak or dizzy, or you faint.  You are pregnant and you develop symptoms of chlamydia. Summary  Chlamydia is an STD (sexually transmitted disease). It is a bacterial infection that spreads (is contagious) through sexual contact.  This condition is not difficult to treat, however. If left untreated, chlamydia can lead to more serious health problems, including pelvic inflammatory disease (PID).  In some cases, there may not be any symptoms for this condition (asymptomatic).  This condition is treated with antibiotic medicines.  Using latex condoms correctly every time you have sex can help prevent chlamydia. This information is not intended to replace advice given to you by your health care provider. Make sure you discuss any questions you have with your health care provider. Document Revised: 12/21/2017 Document Reviewed: 06/15/2016 Elsevier Patient Education  2020 [place] Allergy A drug allergy is when your body reacts in a bad way to a medicine. The reaction may be mild or very bad. In some cases, it can be life-threatening. If you have an allergic reaction, get help right away. You should get help even if the reaction seems mild. What are the causes? This condition is caused by a reaction in your body's defense system (immune system). The system sees a medicine as being harmful when it is not. What are the signs or symptoms? Symptoms of a mild reaction  A stuffy nose (nasal congestion).  Tingling in your mouth.  An itchy, red rash. Symptoms of a very bad reaction  Swelling of your eyes, lips, face, or tongue.  Swelling of the back of your mouth and your throat.  Breathing loudly (wheezing).  A  hoarse voice.  Itchy, red, swollen areas of skin (hives).  Feeling dizzy or light-headed.  Passing out (fainting).  Feeling worried or nervous (anxiety).  Feeling mixed up (confused).  Pain in your belly (abdomen).  Trouble with breathing, talking, or swallowing.  A tight feeling in your chest.  Fast or uneven heartbeats (palpitations).  Throwing up (vomiting).  Watery poop (diarrhea). How is this treated? There is no cure for allergies. An allergic reaction can be treated with:  Medicines to help your symptoms.  Medicines that you breathe into your lungs (respiratory inhalers).  An allergy shot (epinephrine injection). For a very bad reaction, you may need to stay in the hospital. Your doctor may teach you how to use an allergy kit (anaphylaxis kit) and how to give yourself an allergy shot. You can give yourself an allergy shot with what is called an auto-injector "pen." Follow these instructions at home: If you have a very bad allergy:   Always keep an auto-injector pen or your allergy kit with you. These could save your life. Use them as told by your doctor.  Make sure that you, the people who live with you, and your employer know: ? How to use your allergy kit. ? How to use an auto-injector pen to give you an allergy  shot.  If you used your auto-injector pen: ? Get more medicine for it right away. This is important in case you have another reaction. ? Get help right away.  Wear a medical alert bracelet or necklace that says you have an allergy, if your doctor tells you to do this. General instructions  Avoid medicines that you are allergic to.  Take over-the-counter and prescription medicines only as told by your doctor.  Do not drive until your doctor says it is safe.  If you have itchy, red, swollen areas of skin or a rash: ? Use an over-the-counter medicine (antihistamine) as told by your doctor. ? Put cold, wet cloths (cold compresses) on your  skin. ? Take baths or showers in cool water. Avoid hot water.  If you had tests done, it is up to you to get your test results. Ask your doctor when your results will be ready.  Tell any doctors who care for you that you have a drug allergy.  Keep all follow-up visits as told by your doctor. This is important. Contact a doctor if:  You think that you are having a mild allergic reaction.  You have symptoms that last more than 2 days after your reaction.  Your symptoms get worse.  You get new symptoms. Get help right away if:  You had to use your auto-injector pen. You must go to the emergency room, even if the medicine seems to be working.  You have symptoms of a very bad allergic reaction. These symptoms may be an emergency. Do not wait to see if the symptoms will go away. Use your auto-injector pen or allergy kit as you have been told. Get medical help right away. Call your local emergency services (911 in the [place] Do not drive yourself to the hospital. Summary  A drug allergy is when your body reacts in a bad way to a medicine.  Take medicines only as told by your doctor.  Tell any doctors who care for you that you have a drug allergy.  Always keep an auto-injector pen or your allergy kit with you if you have a very bad allergy. This information is not intended to replace advice given to you by your health care provider. Make sure you discuss any questions you have with your health care provider. Document Revised: 01/12/2018 Document Reviewed: 01/12/2018 Elsevier Patient Education  [place].

## 2019-08-24 NOTE — Progress Notes (Signed)
Pt was laying on arm, switched BP cuff to other arm.

## 2019-08-28 ENCOUNTER — Ambulatory Visit (HOSPITAL_COMMUNITY): Admission: RE | Admit: 2019-08-28 | Payer: Medicaid Other | Source: Ambulatory Visit

## 2019-08-28 ENCOUNTER — Ambulatory Visit: Payer: Medicaid Other

## 2019-09-29 ENCOUNTER — Encounter (HOSPITAL_COMMUNITY): Payer: Self-pay | Admitting: Obstetrics and Gynecology

## 2019-09-29 ENCOUNTER — Inpatient Hospital Stay (HOSPITAL_COMMUNITY)
Admission: AD | Admit: 2019-09-29 | Discharge: 2019-09-29 | Disposition: A | Discharge status: Home / Self Care | Payer: Medicaid Other | Attending: Obstetrics and Gynecology | Admitting: Obstetrics and Gynecology

## 2019-09-29 ENCOUNTER — Other Ambulatory Visit: Payer: Self-pay

## 2019-09-29 DIAGNOSIS — Z3A11 11 weeks gestation of pregnancy: Secondary | ICD-10-CM | POA: Insufficient documentation

## 2019-09-29 DIAGNOSIS — Z881 Allergy status to other antibiotic agents status: Secondary | ICD-10-CM | POA: Diagnosis not present

## 2019-09-29 DIAGNOSIS — Z87891 Personal history of nicotine dependence: Secondary | ICD-10-CM | POA: Diagnosis not present

## 2019-09-29 DIAGNOSIS — O26891 Other specified pregnancy related conditions, first trimester: Secondary | ICD-10-CM | POA: Diagnosis not present

## 2019-09-29 DIAGNOSIS — Z951 Presence of aortocoronary bypass graft: Secondary | ICD-10-CM | POA: Diagnosis not present

## 2019-09-29 DIAGNOSIS — R102 Pelvic and perineal pain: Secondary | ICD-10-CM | POA: Diagnosis not present

## 2019-09-29 DIAGNOSIS — Z202 Contact with and (suspected) exposure to infections with a predominantly sexual mode of transmission: Secondary | ICD-10-CM

## 2019-09-29 DIAGNOSIS — Z880 Allergy status to penicillin: Secondary | ICD-10-CM | POA: Insufficient documentation

## 2019-09-29 DIAGNOSIS — N898 Other specified noninflammatory disorders of vagina: Secondary | ICD-10-CM | POA: Insufficient documentation

## 2019-09-29 LAB — WET PREP, GENITAL
Clue Cells Wet Prep HPF POC: NONE SEEN
Sperm: NONE SEEN
Trich, Wet Prep: NONE SEEN
Yeast Wet Prep HPF POC: NONE SEEN

## 2019-09-29 LAB — URINALYSIS, ROUTINE W REFLEX MICROSCOPIC
Bilirubin Urine: NEGATIVE
Glucose, UA: NEGATIVE mg/dL
Hgb urine dipstick: NEGATIVE
Ketones, ur: NEGATIVE mg/dL
Leukocytes,Ua: NEGATIVE
Nitrite: NEGATIVE
Protein, ur: NEGATIVE mg/dL
Specific Gravity, Urine: 1.021 (ref 1.005–1.030)
pH: 5 (ref 5.0–8.0)

## 2019-09-29 LAB — RPR: RPR Ser Ql: NONREACTIVE

## 2019-09-29 LAB — RAPID HIV SCREEN (HIV 1/2 AB+AG)
HIV 1/2 Antibodies: NONREACTIVE
HIV-1 P24 Antigen - HIV24: NONREACTIVE

## 2019-09-29 LAB — HEPATITIS C ANTIBODY: HCV Ab: NONREACTIVE

## 2019-09-29 LAB — HEPATITIS B SURFACE ANTIGEN: Hepatitis B Surface Ag: NONREACTIVE

## 2019-09-29 NOTE — MAU Provider Note (Signed)
Chief Complaint: Vaginal Discharge and Abdominal Pain   First Provider Initiated Contact with Patient 09/29/19 0149        SUBJECTIVE HPI: Claire Christensen is a 19 y.o. G2P1001 at [redacted]w[redacted]d by LMP who presents to maternity admissions reporting pelvic pain and green discharge.  Worried she has chlamydia.  She and partner took meds but had IC 7 days later. . She denies vaginal bleeding, vaginal itching/burning, urinary symptoms, h/a, dizziness, n/v, or fever/chills.    Gets care at [facility] and [place] (for heart condition).  Comes here because it is closer. Plans to deliver at [facility]    Had appt here in Feb but did not show up  Vaginal Discharge The patient's primary symptoms include pelvic pain and vaginal discharge. The patient's pertinent negatives include no genital itching, genital lesions, genital odor or vaginal bleeding. This is a new problem. The current episode started in the past 7 days. The problem occurs constantly. The problem has been unchanged. The pain is mild. She is pregnant. Associated symptoms include abdominal pain. Pertinent negatives include no back pain, chills, constipation, diarrhea, fever, nausea or vomiting. The vaginal discharge was green. There has been no bleeding. She has not been passing clots. She has not been passing tissue. Nothing aggravates the symptoms. She has tried nothing for the symptoms.  Abdominal Pain This is a new problem. The current episode started in the past 7 days. The onset quality is gradual. The problem occurs intermittently. The pain is located in the suprapubic region and RLQ. The quality of the pain is cramping. The abdominal pain does not radiate. Pertinent negatives include no constipation, diarrhea, fever, nausea or vomiting. Nothing aggravates the pain. The pain is relieved by nothing. She has tried nothing for the symptoms.     RN Note: Patient reports some constant stomach pains that have gone on for the past 2 days and some green  discharge.  States she was positive for chlamydia at the beginning of this month but never returned for a test of cure.  Patient requests all STD testing.  Some spotting that has been going on the whole pregnancy.  Also reports emesis  Past Medical History:  Diagnosis Date  . Tetralogy of Fallot    Past Surgical History:  Procedure Laterality Date  . CORONARY ARTERY BYPASS GRAFT     Social History   Socioeconomic History  . Marital status: Single    Spouse name: Not on file  . Number of children: Not on file  . Years of education: Not on file  . Highest education level: Not on file  Occupational History  . Not on file  Tobacco Use  . Smoking status: Former Smoker    Types: Cigarettes  . Smokeless tobacco: Never Used  Substance and Sexual Activity  . Alcohol use: Not Currently  . Drug use: Not Currently  . Sexual activity: Yes  Other Topics Concern  . Not on file  Social History Narrative  . Not on file   Social Determinants of Health   Financial Resource Strain:   . Difficulty of Paying Living Expenses:   Food Insecurity:   . Worried About Programme researcher, broadcasting/film/video in the Last Year:   . Barista in the Last Year:   Transportation Needs:   . Freight forwarder (Medical):   Marland Kitchen Lack of Transportation (Non-Medical):   Physical Activity:   . Days of Exercise per Week:   . Minutes of Exercise per Session:  Stress:   . Feeling of Stress :   Social Connections:   . Frequency of Communication with Friends and Family:   . Frequency of Social Gatherings with Friends and Family:   . Attends Religious Services:   . Active Member of Clubs or Organizations:   . Attends Archivist Meetings:   Marland Kitchen Marital Status:   Intimate Partner Violence:   . Fear of Current or Ex-Partner:   . Emotionally Abused:   Marland Kitchen Physically Abused:   . Sexually Abused:    No current facility-administered medications on file prior to encounter.   Current Outpatient Medications on File  Prior to Encounter  Medication Sig Dispense Refill  . acetaminophen (TYLENOL) 500 MG tablet Take 500 mg by mouth every 6 (six) hours as needed.    . diphenhydrAMINE (BENADRYL) 25 MG tablet Take 1 tablet (25 mg total) by mouth every 6 (six) hours as needed. 30 tablet 0  . metoCLOPramide (REGLAN) 10 MG tablet Take 1 tablet (10 mg total) by mouth every 6 (six) hours as needed for nausea or vomiting (nausea/headache). 12 tablet 0  . Prenatal Vit-Fe Fumarate-FA (PRENATAL COMPLETE) 14-0.4 MG TABS Take 1 tablet by mouth daily. 90 tablet 0   Allergies  Allergen Reactions  . Zithromax [Azithromycin] Hives    States was a child, told by mother Given with premed (Benadry/Vistaril) with no reaction  . Augmentin [Amoxicillin-Pot Clavulanate] Hives  . Omnicef [Cefdinir] Hives  . Penicillins Hives    Did it involve swelling of the face/tongue/throat, SOB, or low BP? Yes Did it involve sudden or severe rash/hives, skin peeling, or any reaction on the inside of your mouth or nose? No Did you need to seek medical attention at a hospital or doctor's office? Yes When did it last happen?pt was a child If all above answers are "NO", may proceed with cephalosporin use.     I have reviewed patient's Past Medical Hx, Surgical Hx, Family Hx, Social Hx, medications and allergies.   ROS:  Review of Systems  Constitutional: Negative for chills and fever.  Gastrointestinal: Positive for abdominal pain. Negative for constipation, diarrhea, nausea and vomiting.  Genitourinary: Positive for pelvic pain and vaginal discharge.  Musculoskeletal: Negative for back pain.   Review of Systems  Other systems negative   Physical Exam  Physical Exam Patient Vitals for the past 24 hrs:  BP Temp Resp Weight  09/29/19 0104 107/60 98.5 F (36.9 C) 19 54.5 kg   Constitutional: Well-developed, well-nourished female in no acute distress.  Cardiovascular: normal rate Respiratory: normal effort GI: Abd soft,  non-tender. Pos BS x 4 MS: Extremities nontender, no edema, normal ROM Neurologic: Alert and oriented x 4.  GU: Neg CVAT.  PELVIC EXAM: Cervix pink, visually closed, without lesion, scant white creamy discharge, vaginal walls and external genitalia normal Bimanual exam: Cervix 0/long/high, firm, anterior, neg CMT, uterus nontender, enlarged, adnexa without tenderness, enlargement, or mass  FHT 160 by bedside US   LAB RESULTS Results for orders placed or performed during the hospital encounter of 09/29/19 (from the past 24 hour(s))  Urinalysis, Routine w reflex microscopic     Status: None   Collection Time: 09/29/19  1:23 AM  Result Value Ref Range   Color, Urine YELLOW YELLOW   APPearance CLEAR CLEAR   Specific Gravity, Urine 1.021 1.005 - 1.030   pH 5.0 5.0 - 8.0   Glucose, UA NEGATIVE NEGATIVE mg/dL   Hgb urine dipstick NEGATIVE NEGATIVE   Bilirubin Urine NEGATIVE NEGATIVE  Ketones, ur NEGATIVE NEGATIVE mg/dL   Protein, ur NEGATIVE NEGATIVE mg/dL   Nitrite NEGATIVE NEGATIVE   Leukocytes,Ua NEGATIVE NEGATIVE  Rapid HIV screen (HIV 1/2 Ab+Ag)     Status: None   Collection Time: 09/29/19  2:01 AM  Result Value Ref Range   HIV-1 P24 Antigen - HIV24 NON REACTIVE NON REACTIVE   HIV 1/2 Antibodies NON REACTIVE NON REACTIVE   Interpretation (HIV Ag Ab)      A non reactive test result means that HIV 1 or HIV 2 antibodies and HIV 1 p24 antigen were not detected in the specimen.  Wet prep, genital     Status: Abnormal   Collection Time: 09/29/19  2:16 AM  Result Value Ref Range   Yeast Wet Prep HPF POC NONE SEEN NONE SEEN   Trich, Wet Prep NONE SEEN NONE SEEN   Clue Cells Wet Prep HPF POC NONE SEEN NONE SEEN   WBC, Wet Prep HPF POC FEW (A) NONE SEEN   Sperm NONE SEEN     --/--/O POS Performed at [facility], [address], [place], [place] [address]  (02/09 0107)  IMAGING No results found.  MAU Management/MDM: Wet prep done and is  negative GC/Chlamydia sent Blood tests sent for STD testing Reviewed these will all come back later.  Reviewed Wet Prep is negative.   US showed live baby.  ASSESSMENT Single IUP at [redacted]w[redacted]d Pelvic pain  Vaginal discharge  PLAN Discharge home Will check her MyChart app for results Recommend keep appt with MD in [place]  Pt stable at time of discharge. Encouraged to return here or to other Urgent Care/ED if she develops worsening of symptoms, increase in pain, fever, or other concerning symptoms.    Wynelle Bourgeois CNM, MSN Certified Nurse-Midwife 09/29/2019  1:49 AM

## 2019-09-29 NOTE — Discharge Instructions (Signed)
First Trimester of Pregnancy  The first trimester of pregnancy is from week 1 until the end of week 13 (months 1 through 3). During this time, your baby will begin to develop inside you. At 6-8 weeks, the eyes and face are formed, and the heartbeat can be seen on ultrasound. At the end of 12 weeks, all the baby's organs are formed. Prenatal care is all the medical care you receive before the birth of your baby. Make sure you get good prenatal care and follow all of your doctor's instructions. Follow these instructions at home: Medicines  Take over-the-counter and prescription medicines only as told by your doctor. Some medicines are safe and some medicines are not safe during pregnancy.  Take a prenatal vitamin that contains at least 600 micrograms (mcg) of folic acid.  If you have trouble pooping (constipation), take medicine that will make your stool soft (stool softener) if your doctor approves. Eating and drinking   Eat regular, healthy meals.  Your doctor will tell you the amount of weight gain that is right for you.  Avoid raw meat and uncooked cheese.  If you feel sick to your stomach (nauseous) or throw up (vomit): ? Eat 4 or 5 small meals a day instead of 3 large meals. ? Try eating a few soda crackers. ? Drink liquids between meals instead of during meals.  To prevent constipation: ? Eat foods that are high in fiber, like fresh fruits and vegetables, whole grains, and beans. ? Drink enough fluids to keep your pee (urine) clear or pale yellow. Activity  Exercise only as told by your doctor. Stop exercising if you have cramps or pain in your lower belly (abdomen) or low back.  Do not exercise if it is too hot, too humid, or if you are in a place of great height (high altitude).  Try to avoid standing for long periods of time. Move your legs often if you must stand in one place for a long time.  Avoid heavy lifting.  Wear low-heeled shoes. Sit and stand up  straight.  You can have sex unless your doctor tells you not to. Relieving pain and discomfort  Wear a good support bra if your breasts are sore.  Take warm water baths (sitz baths) to soothe pain or discomfort caused by hemorrhoids. Use hemorrhoid cream if your doctor says it is okay.  Rest with your legs raised if you have leg cramps or low back pain.  If you have puffy, bulging veins (varicose veins) in your legs: ? Wear support hose or compression stockings as told by your doctor. ? Raise (elevate) your feet for 15 minutes, 3-4 times a day. ? Limit salt in your food. Prenatal care  Schedule your prenatal visits by the twelfth week of pregnancy.  Write down your questions. Take them to your prenatal visits.  Keep all your prenatal visits as told by your doctor. This is important. Safety  Wear your seat belt at all times when driving.  Make a list of emergency phone numbers. The list should include numbers for family, friends, the hospital, and police and fire departments. General instructions  Ask your doctor for a referral to a local prenatal class. Begin classes no later than at the start of month 6 of your pregnancy.  Ask for help if you need counseling or if you need help with nutrition. Your doctor can give you advice or tell you where to go for help.  Do not use hot tubs, steam   rooms, or saunas.  Do not douche or use tampons or scented sanitary pads.  Do not cross your legs for long periods of time.  Avoid all herbs and alcohol. Avoid drugs that are not approved by your doctor.  Do not use any tobacco products, including cigarettes, chewing tobacco, and electronic cigarettes. If you need help quitting, ask your doctor. You may get counseling or other support to help you quit.  Avoid cat litter boxes and soil used by cats. These carry germs that can cause birth defects in the baby and can cause a loss of your baby (miscarriage) or stillbirth.  Visit your dentist.  At home, brush your teeth with a soft toothbrush. Be gentle when you floss. Contact a doctor if:  You are dizzy.  You have mild cramps or pressure in your lower belly.  You have a nagging pain in your belly area.  You continue to feel sick to your stomach, you throw up, or you have watery poop (diarrhea).  You have a bad smelling fluid coming from your vagina.  You have pain when you pee (urinate).  You have increased puffiness (swelling) in your face, hands, legs, or ankles. Get help right away if:  You have a fever.  You are leaking fluid from your vagina.  You have spotting or bleeding from your vagina.  You have very bad belly cramping or pain.  You gain or lose weight rapidly.  You throw up blood. It may look like coffee grounds.  You are around people who have [place] measles, fifth disease, or chickenpox.  You have a very bad headache.  You have shortness of breath.  You have any kind of trauma, such as from a fall or a car accident. Summary  The first trimester of pregnancy is from week 1 until the end of week 13 (months 1 through 3).  To take care of yourself and your unborn baby, you will need to eat healthy meals, take medicines only if your doctor tells you to do so, and do activities that are safe for you and your baby.  Keep all follow-up visits as told by your doctor. This is important as your doctor will have to ensure that your baby is healthy and growing well. This information is not intended to replace advice given to you by your health care provider. Make sure you discuss any questions you have with your health care provider. Document Revised: 10/20/2018 Document Reviewed: 07/07/2016 Elsevier Patient Education  2020 [employer]. Vaginitis Vaginitis is a condition in which the vaginal tissue swells and becomes red (inflamed). This condition is most often caused by a change in the normal balance of bacteria and yeast that live in the vagina. This  change causes an overgrowth of certain bacteria or yeast, which causes the inflammation. There are different types of vaginitis, but the most common types are:  Bacterial vaginosis.  Yeast infection (candidiasis).  Trichomoniasis vaginitis. This is a sexually transmitted disease (STD).  Viral vaginitis.  Atrophic vaginitis.  Allergic vaginitis. What are the causes? The cause of this condition depends on the type of vaginitis. It can be caused by:  Bacteria (bacterial vaginosis).  Yeast, which is a fungus (yeast infection).  A parasite (trichomoniasis vaginitis).  A virus (viral vaginitis).  Low hormone levels (atrophic vaginitis). Low hormone levels can occur during pregnancy, breastfeeding, or after menopause.  Irritants, such as bubble baths, scented tampons, and feminine sprays (allergic vaginitis). Other factors can change the normal balance of the  yeast and bacteria that live in the vagina. These include:  Antibiotic medicines.  Poor hygiene.  Diaphragms, vaginal sponges, spermicides, birth control pills, and intrauterine devices (IUD).  Sex.  Infection.  Uncontrolled diabetes.  A weakened defense (immune) system. What increases the risk? This condition is more likely to develop in women who:  Smoke.  Use vaginal douches, scented tampons, or scented sanitary pads.  Wear tight-fitting pants.  Wear thong underwear.  Use oral birth control pills or an IUD.  Have sex without a condom.  Have multiple sex partners.  Have an STD.  Frequently use the spermicide nonoxynol-9.  Eat lots of foods high in sugar.  Have uncontrolled diabetes.  Have low estrogen levels.  Have a weakened immune system from an immune disorder or medical treatment.  Are pregnant or breastfeeding. What are the signs or symptoms? Symptoms vary depending on the cause of the vaginitis. Common symptoms include:  Abnormal vaginal discharge. ? The discharge is white, gray, or  yellow with bacterial vaginosis. ? The discharge is thick, white, and cheesy with a yeast infection. ? The discharge is frothy and yellow or greenish with trichomoniasis.  A bad vaginal smell. The smell is fishy with bacterial vaginosis.  Vaginal itching, pain, or swelling.  Sex that is painful.  Pain or burning when urinating. Sometimes there are no symptoms. How is this diagnosed? This condition is diagnosed based on your symptoms and medical history. A physical exam, including a pelvic exam, will also be done. You may also have other tests, including:  Tests to determine the pH level (acidity or alkalinity) of your vagina.  A whiff test, to assess the odor that results when a sample of your vaginal discharge is mixed with a potassium hydroxide solution.  Tests of vaginal fluid. A sample will be examined under a microscope. How is this treated? Treatment varies depending on the type of vaginitis you have. Your treatment may include:  Antibiotic creams or pills to treat bacterial vaginosis and trichomoniasis.  Antifungal medicines, such as vaginal creams or suppositories, to treat a yeast infection.  Medicine to ease discomfort if you have viral vaginitis. Your sexual partner should also be treated.  Estrogen delivered in a cream, pill, suppository, or vaginal ring to treat atrophic vaginitis. If vaginal dryness occurs, lubricants and moisturizing creams may help. You may need to avoid scented soaps, sprays, or douches.  Stopping use of a product that is causing allergic vaginitis. Then using a vaginal cream to treat the symptoms. Follow these instructions at home: Lifestyle  Keep your genital area clean and dry. Avoid soap, and only rinse the area with water.  Do not douche or use tampons until your health care provider says it is okay to do so. Use sanitary pads, if needed.  Do not have sex until your health care provider approves. When you can return to sex, practice safe  sex and use condoms.  Wipe from front to back. This avoids the spread of bacteria from the rectum to the vagina. General instructions  Take over-the-counter and prescription medicines only as told by your health care provider.  If you were prescribed an antibiotic medicine, take or use it as told by your health care provider. Do not stop taking or using the antibiotic even if you start to feel better.  Keep all follow-up visits as told by your health care provider. This is important. How is this prevented?  Use mild, non-scented products. Do not use things that can irritate the  vagina, such as fabric softeners. Avoid the following products if they are scented: ? Feminine sprays. ? Detergents. ? Tampons. ? Feminine hygiene products. ? Soaps or bubble baths.  Let air reach your genital area. ? Wear cotton underwear to reduce moisture buildup. ? Avoid wearing underwear while you sleep. ? Avoid wearing tight pants and underwear or nylons without a cotton panel. ? Avoid wearing thong underwear.  Take off any wet clothing, such as bathing suits, as soon as possible.  Practice safe sex and use condoms. Contact a health care provider if:  You have abdominal pain.  You have a fever.  You have symptoms that last for more than 2-3 days. Get help right away if:  You have a fever and your symptoms suddenly get worse. Summary  Vaginitis is a condition in which the vaginal tissue becomes inflamed.This condition is most often caused by a change in the normal balance of bacteria and yeast that live in the vagina.  Treatment varies depending on the type of vaginitis you have.  Do not douche, use tampons , or have sex until your health care provider approves. When you can return to sex, practice safe sex and use condoms. This information is not intended to replace advice given to you by your health care provider. Make sure you discuss any questions you have with your health care  provider. Document Revised: 06/11/2017 Document Reviewed: 08/04/2016 Elsevier Patient Education  2020 [employer].

## 2019-09-29 NOTE — MAU Note (Signed)
Patient reports some constant stomach pains that have gone on for the past 2 days and some green discharge.  States she was positive for chlamydia at the beginning of this month but never returned for a test of cure.  Patient requests all STD testing.  Some spotting that has been going on the whole pregnancy.  Also reports emesis.

## 2019-10-02 LAB — GC/CHLAMYDIA PROBE AMP (~~LOC~~) NOT AT ARMC
Chlamydia: NEGATIVE
Comment: NEGATIVE
Comment: NORMAL
Neisseria Gonorrhea: NEGATIVE

## 2019-10-11 ENCOUNTER — Emergency Department (HOSPITAL_COMMUNITY)
Admission: EM | Admit: 2019-10-11 | Discharge: 2019-10-11 | Discharge status: Against Medical Advice | Payer: Medicaid Other

## 2019-10-11 ENCOUNTER — Encounter (HOSPITAL_COMMUNITY): Payer: Self-pay | Admitting: Emergency Medicine

## 2019-10-11 ENCOUNTER — Other Ambulatory Visit: Payer: Self-pay

## 2019-10-11 ENCOUNTER — Emergency Department (HOSPITAL_COMMUNITY)
Admission: EM | Admit: 2019-10-11 | Discharge: 2019-10-11 | Disposition: A | Discharge status: Home / Self Care | Payer: Medicaid Other | Attending: Emergency Medicine | Admitting: Emergency Medicine

## 2019-10-11 DIAGNOSIS — Z20822 Contact with and (suspected) exposure to covid-19: Secondary | ICD-10-CM | POA: Diagnosis not present

## 2019-10-11 DIAGNOSIS — Z87891 Personal history of nicotine dependence: Secondary | ICD-10-CM | POA: Diagnosis not present

## 2019-10-11 DIAGNOSIS — R05 Cough: Secondary | ICD-10-CM | POA: Insufficient documentation

## 2019-10-11 DIAGNOSIS — Z3A13 13 weeks gestation of pregnancy: Secondary | ICD-10-CM | POA: Insufficient documentation

## 2019-10-11 DIAGNOSIS — J069 Acute upper respiratory infection, unspecified: Secondary | ICD-10-CM | POA: Diagnosis not present

## 2019-10-11 DIAGNOSIS — M7918 Myalgia, other site: Secondary | ICD-10-CM | POA: Diagnosis not present

## 2019-10-11 DIAGNOSIS — R519 Headache, unspecified: Secondary | ICD-10-CM | POA: Diagnosis not present

## 2019-10-11 DIAGNOSIS — R110 Nausea: Secondary | ICD-10-CM | POA: Insufficient documentation

## 2019-10-11 DIAGNOSIS — O2691 Pregnancy related conditions, unspecified, first trimester: Secondary | ICD-10-CM | POA: Diagnosis present

## 2019-10-11 LAB — SARS CORONAVIRUS 2 (TAT 6-24 HRS): SARS Coronavirus 2: NEGATIVE

## 2019-10-11 NOTE — ED Triage Notes (Signed)
Pt here with c/o covid  Symptoms no fever but has h/a loss of taste and smell , friend of pt just tested positive

## 2019-10-11 NOTE — Discharge Instructions (Addendum)
Continue take Tylenol as needed for your body aches, fevers, and chills.  You should also consider applying a menthol rub such as Vicks vapor rub to your chest and under your nose.  This may provide some symptomatic relief.  You would also benefit from taking a pregnancy safe cough syrup such as Robitussin that you can obtain over-the-counter.  I recommend that you continue with warm tea and throat lozenges, as well.  Please follow-up with your primary care provider as well as your OB/GYN regarding today's encounter so that you may receive ongoing evaluation management.  Strict isolation precautions pending results of your COVID-19 testing.  Return to the ED or seek immediate medical attention should you experience any new or worsening symptoms.

## 2019-10-11 NOTE — ED Provider Notes (Signed)
[facility] EMERGENCY DEPARTMENT Provider Note   CSN: [id number] Arrival date & time: 10/11/19  1241     History No chief complaint on file.   Karessa Onorato is a 19 y.o. female G17P1001 13w gestation with PMH of tetralogy of Fallot who presents to the ED with complaints of COVID-19 symptoms.  Patient reports that beginning yesterday afternoon she developed runny nose, congestion, mild sore throat, nonproductive cough, headache, and generalized body aches.  She states that she lives with her brother who tested positive for COVID-19 and she had another close friend who is also positive.  She reports that she was tested a few weeks ago, however was asymptomatic at that time and was obtaining testing as a screening.  She also endorses mild nausea and diminished appetite, however no vomiting, urinary symptoms, or changes in bowel habits.  She denies any fevers or chills, chest pain, difficulty breathing, significant abdominal discomfort, difficulty swallowing, voice change, trismus, drooling, numbness or weakness, or other symptoms.  She has not taken any for her medications.  Finally, she endorses loss of taste and smell.  She is concerned for COVID-19.  HPI     Past Medical History:  Diagnosis Date  . Tetralogy of Fallot     Patient Active Problem List   Diagnosis Date Noted  . Pregnancy of unknown anatomic location 08/15/2019  . 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 RBBB 04/13/2019  . 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 CORONARY ARTERY BYPASS GRAFT       OB History    Gravida  2   Para  1   Term  1   Preterm      AB      Living  1     SAB      TAB      Ectopic      Multiple      Live Births  1           Family History  Problem Relation Age of Onset  . Heart disease Mother   . Thalassemia Father     Social History    Tobacco Use  . Smoking status: Former Smoker    Types: Cigarettes  . Smokeless tobacco: Never Used  Substance Use Topics  . Alcohol use: Not Currently  . Drug use: Not Currently    Home Medications Prior to Admission medications   Medication Sig Start Date End Date Taking? Authorizing Provider  acetaminophen (TYLENOL) 500 MG tablet Take 500 mg by mouth every 6 (six) hours as needed.    [provider]  diphenhydrAMINE (BENADRYL) 25 MG tablet Take 1 tablet (25 mg total) by mouth every 6 (six) hours as needed. 08/24/19   Aviva Signs, CNM  metoCLOPramide (REGLAN) 10 MG tablet Take 1 tablet (10 mg total) by mouth every 6 (six) hours as needed for nausea or vomiting (nausea/headache). 08/16/19   Molpus, John, MD  Prenatal Vit-Fe Fumarate-FA (PRENATAL COMPLETE) 14-0.4 MG TABS Take 1 tablet by mouth daily. 08/16/19   Molpus, John, MD    Allergies    Zithromax [azithromycin], Augmentin [amoxicillin-pot clavulanate], Omnicef [cefdinir], and Penicillins  Review of Systems   Review of Systems  All other systems reviewed and are negative.   Physical Exam Updated Vital Signs BP 114/69   Pulse (!) 103   Temp 98.3 F (36.8 C)   Resp 18  LMP  (LMP Unknown)   SpO2 97%   Physical Exam Vitals and nursing note reviewed. Exam conducted with a chaperone present.  Constitutional:      Appearance: Normal appearance.  HENT:     Head: Normocephalic and atraumatic.     Nose: Congestion and rhinorrhea present.     Mouth/Throat:     Comments: Patent oropharynx.  No tonsillar hypertrophy or exudates noted.  Uvula is midline.  Tolerating secretions well.  No trismus. Eyes:     General: No scleral icterus.    Extraocular Movements: Extraocular movements intact.     Conjunctiva/sclera: Conjunctivae normal.     Pupils: Pupils are equal, round, and reactive to light.  Cardiovascular:     Rate and Rhythm: Normal rate and regular rhythm.     Pulses: Normal pulses.     Comments:  Systolic heart murmur. Pulmonary:     Effort: Pulmonary effort is normal. No respiratory distress.     Breath sounds: Normal breath sounds. No wheezing or rales.  Abdominal:     Palpations: Abdomen is soft.     Tenderness: There is no abdominal tenderness. There is no guarding.  Musculoskeletal:     Cervical back: Normal range of motion and neck supple. No rigidity.  Skin:    General: Skin is dry.     Capillary Refill: Capillary refill takes less than 2 seconds.  Neurological:     Mental Status: She is alert and oriented to person, place, and time.     GCS: GCS eye subscore is 4. GCS verbal subscore is 5. GCS motor subscore is 6.     Gait: Gait normal.  Psychiatric:        Mood and Affect: Mood normal.        Behavior: Behavior normal.        Thought Content: Thought content normal.     ED Results / Procedures / Treatments   Labs (all labs ordered are listed, but only abnormal results are displayed) Labs Reviewed  SARS CORONAVIRUS 2 (TAT 6-24 HRS)    EKG None  Radiology No results found.  Procedures Procedures (including critical care time)  Medications Ordered in ED Medications - No data to display  ED Course  I have reviewed the triage vital signs and the nursing notes.  Pertinent labs & imaging results that were available during my care of the patient were reviewed by me and considered in my medical decision making (see chart for details).    MDM Rules/Calculators/A&P                      Patient's history and physical exam is consistent with an upper respiratory infection, likely COVID-19 based on patient's exposure and reported symptoms.  Her vital signs are reassuring here in the ED, aside from mildly elevated heart rate that is likely attributable to diminished p.o. intake.  Encouraging her to consume plenty of fluids and eat regular meals and effort to avoid dehydration or electrolyte derangement.  Patient is also [redacted] weeks pregnant so her cold and flu  symptoms will have to be managed more conservatively.  Recommending that she continue to take Tylenol as needed for her body aches, fevers, and chills.  Also encouraging her to apply topical menthol rub to her chest and under her nose.  She may also benefit from taking pregnancy safe cough syrups such as Robitussin that she can obtain over-the-counter.  Recommended warm tea and lozenges for her sore throat symptoms.  I have low suspicion for strep pharyngitis, PTA, RPA, or other pathology that can contribute to sore throat that would warrant further investigation and/or alternative management.  Given brevity of her symptoms, do not feel as though laboratory work-up or plain films of chest would yield any significant findings.  Low suspicion for PNA.  Patient is in no acute distress, albeit feeling under the weather.  Discussed isolation precautions with patient.  Patient is safe for discharge at this time.  However, emphasized strict ED return precautions.  Will obtain PCR COVID-19 testing that she will have results on in the next 6 to 24 hours.  She should follow-up with her PCP as well as her OB/GYN regarding today's encounter.  Ottilie Wigglesworth was evaluated in Emergency Department on 10/11/2019 for the symptoms described in the history of present illness. She was evaluated in the context of the global COVID-19 pandemic, which necessitated consideration that the patient might be at risk for infection with the SARS-CoV-2 virus that causes COVID-19. Institutional protocols and algorithms that pertain to the evaluation of patients at risk for COVID-19 are in a state of rapid change based on information released by regulatory bodies including the CDC and federal and state organizations. These policies and algorithms were followed during the patient's care in the ED.   Final Clinical Impression(s) / ED Diagnoses Final diagnoses:  Viral upper respiratory tract infection    Rx / DC Orders ED Discharge  Orders    None       Corena Herter, PA-C 10/11/19 1347    Lennice Sites, DO 10/11/19 1621

## 2019-10-11 NOTE — ED Notes (Signed)
Patient verbalizes understanding of discharge instructions. Opportunity for questioning and answers were provided. Armband removed by staff, pt discharged from ED.  

## 2019-11-15 ENCOUNTER — Ambulatory Visit: Payer: Medicaid Other | Attending: Internal Medicine

## 2019-11-15 DIAGNOSIS — Z23 Encounter for immunization: Secondary | ICD-10-CM

## 2019-11-15 NOTE — Progress Notes (Signed)
   Covid-19 Vaccination Clinic  Name:  Claire Christensen    MRN: [id number] DOB: May 26, 2001  11/15/2019  [name] was observed post Covid-19 immunization for 15 minutes without incident. She was provided with Vaccine Information Sheet and instruction to access the V-Safe system.   [name] was instructed to call 911 with any severe reactions post vaccine: Marland Kitchen Difficulty breathing  . Swelling of face and throat  . A fast heartbeat  . A bad rash all over body  . Dizziness and weakness   Immunizations Administered    Name Date Dose VIS Date Route   Pfizer COVID-19 Vaccine 11/15/2019 11:17 AM 0.3 mL 09/06/2018 Intramuscular   Manufacturer: [employer], [employer]   Lot: [id number]   NDC: [address]-2

## 2019-12-12 ENCOUNTER — Ambulatory Visit: Payer: Medicaid Other | Attending: Internal Medicine

## 2019-12-12 DIAGNOSIS — Z23 Encounter for immunization: Secondary | ICD-10-CM

## 2019-12-12 NOTE — Progress Notes (Signed)
   Covid-19 Vaccination Clinic  Name:  Claire Christensen    MRN: [id number] DOB: September 05, 2000  12/12/2019  [name] was observed post Covid-19 immunization for 15 minutes without incident. She was provided with Vaccine Information Sheet and instruction to access the V-Safe system.   [name] was instructed to call 911 with any severe reactions post vaccine: Marland Kitchen Difficulty breathing  . Swelling of face and throat  . A fast heartbeat  . A bad rash all over body  . Dizziness and weakness   Immunizations Administered    Name Date Dose VIS Date Route   Pfizer COVID-19 Vaccine 12/12/2019 11:50 AM 0.3 mL 09/06/2018 Intramuscular   Manufacturer: [employer], [employer]   Lot: [id number]   NDC: [address]-7

## 2020-04-16 ENCOUNTER — Other Ambulatory Visit: Payer: Self-pay

## 2020-04-16 ENCOUNTER — Encounter (HOSPITAL_COMMUNITY): Payer: Self-pay | Admitting: *Deleted

## 2020-04-16 ENCOUNTER — Inpatient Hospital Stay (HOSPITAL_COMMUNITY): Payer: Medicaid Other

## 2020-04-16 ENCOUNTER — Inpatient Hospital Stay (HOSPITAL_COMMUNITY)
Admission: AD | Admit: 2020-04-16 | Discharge: 2020-04-16 | Disposition: A | Discharge status: Home / Self Care | Payer: Medicaid Other | Attending: Obstetrics and Gynecology | Admitting: Obstetrics and Gynecology

## 2020-04-16 DIAGNOSIS — K529 Noninfective gastroenteritis and colitis, unspecified: Secondary | ICD-10-CM | POA: Insufficient documentation

## 2020-04-16 DIAGNOSIS — R1010 Upper abdominal pain, unspecified: Secondary | ICD-10-CM

## 2020-04-16 DIAGNOSIS — Z7982 Long term (current) use of aspirin: Secondary | ICD-10-CM | POA: Insufficient documentation

## 2020-04-16 DIAGNOSIS — Z87891 Personal history of nicotine dependence: Secondary | ICD-10-CM | POA: Insufficient documentation

## 2020-04-16 DIAGNOSIS — E876 Hypokalemia: Secondary | ICD-10-CM | POA: Diagnosis not present

## 2020-04-16 DIAGNOSIS — Z951 Presence of aortocoronary bypass graft: Secondary | ICD-10-CM | POA: Diagnosis not present

## 2020-04-16 DIAGNOSIS — Z881 Allergy status to other antibiotic agents status: Secondary | ICD-10-CM | POA: Diagnosis not present

## 2020-04-16 DIAGNOSIS — Z880 Allergy status to penicillin: Secondary | ICD-10-CM | POA: Insufficient documentation

## 2020-04-16 DIAGNOSIS — O9963 Diseases of the digestive system complicating the puerperium: Secondary | ICD-10-CM | POA: Diagnosis not present

## 2020-04-16 DIAGNOSIS — Z79899 Other long term (current) drug therapy: Secondary | ICD-10-CM | POA: Diagnosis not present

## 2020-04-16 DIAGNOSIS — Z8249 Family history of ischemic heart disease and other diseases of the circulatory system: Secondary | ICD-10-CM | POA: Diagnosis not present

## 2020-04-16 LAB — CBC
HCT: 40.6 % (ref 36.0–46.0)
Hemoglobin: 13.1 g/dL (ref 12.0–15.0)
MCH: 25.8 pg — ABNORMAL LOW (ref 26.0–34.0)
MCHC: 32.3 g/dL (ref 30.0–36.0)
MCV: 80.1 fL (ref 80.0–100.0)
Platelets: 314 10*3/uL (ref 150–400)
RBC: 5.07 MIL/uL (ref 3.87–5.11)
RDW: 15.1 % (ref 11.5–15.5)
WBC: 13 10*3/uL — ABNORMAL HIGH (ref 4.0–10.5)
nRBC: 0 % (ref 0.0–0.2)

## 2020-04-16 LAB — COMPREHENSIVE METABOLIC PANEL
ALT: 25 U/L (ref 0–44)
AST: 21 U/L (ref 15–41)
Albumin: 3.3 g/dL — ABNORMAL LOW (ref 3.5–5.0)
Alkaline Phosphatase: 82 U/L (ref 38–126)
Anion gap: 12 (ref 5–15)
BUN: 10 mg/dL (ref 6–20)
CO2: 21 mmol/L — ABNORMAL LOW (ref 22–32)
Calcium: 9 mg/dL (ref 8.9–10.3)
Chloride: 107 mmol/L (ref 98–111)
Creatinine, Ser: 0.61 mg/dL (ref 0.44–1.00)
GFR calc Af Amer: >60 mL/min (ref >60)
GFR calc non Af Amer: >60 mL/min (ref >60)
Glucose, Bld: 99 mg/dL (ref 70–99)
Potassium: 3.3 mmol/L — ABNORMAL LOW (ref 3.5–5.1)
Sodium: 140 mmol/L (ref 135–145)
Total Bilirubin: 0.6 mg/dL (ref 0.3–1.2)
Total Protein: 6.3 g/dL — ABNORMAL LOW (ref 6.5–8.1)

## 2020-04-16 LAB — LIPASE, BLOOD: Lipase: 27 U/L (ref 11–51)

## 2020-04-16 MED ORDER — ONDANSETRON 4 MG PO TBDP: 4.0000 mg | ORAL_TABLET | Freq: Three times a day (TID) | ORAL | 0 refills | Status: AC | PRN

## 2020-04-16 NOTE — MAU Provider Note (Signed)
History     CSN: [id number]  Arrival date and time: 04/16/20 0147   First Provider Initiated Contact with Patient 04/16/20 0215      Chief Complaint  Patient presents with  . Abdominal Pain  . Emesis   19 y.o G2P2002 s/p SVD 5 days ago presenting with upper abdominal pain. Pain started last night. Associated sx are N/V/D. Diarrhea was watery w/o blood. Reports multiple episodes of emesis. Denies sick contacts or fevers. Was tolerating po most of the day, had shrimp alfredo prior that she prepared. Hx of tetrology of fallot with replacement valve.    OB History    Gravida  2   Para  2   Term  1   Preterm      AB      Living  2     SAB      TAB      Ectopic      Multiple      Live Births  1           Past Medical History:  Diagnosis Date  . Tetralogy of Fallot     Past Surgical History:  Procedure Laterality Date  . CORONARY ARTERY BYPASS GRAFT      Family History  Problem Relation Age of Onset  . Heart disease Mother   . Thalassemia Father     Social History   Tobacco Use  . Smoking status: Former Smoker    Types: Cigarettes  . Smokeless tobacco: Never Used  Vaping Use  . Vaping Use: Never used  Substance Use Topics  . Alcohol use: Not Currently  . Drug use: Not Currently    Allergies:  Allergies  Allergen Reactions  . Zithromax [Azithromycin] Hives    States was a child, told by mother Given with premed (Benadry/Vistaril) with no reaction  . Augmentin [Amoxicillin-Pot Clavulanate] Hives  . Omnicef [Cefdinir] Hives  . Penicillins Hives    Did it involve swelling of the face/tongue/throat, SOB, or low BP? Yes Did it involve sudden or severe rash/hives, skin peeling, or any reaction on the inside of your mouth or nose? No Did you need to seek medical attention at a hospital or doctor's office? Yes When did it last happen?pt was a child If all above answers are "NO", may proceed with cephalosporin use.   . Flagyl  [Metronidazole] Rash    Medications Prior to Admission  Medication Sig Dispense Refill Last Dose  . aspirin 81 MG chewable tablet Chew by mouth daily.   04/15/2020 at Unknown time  . acetaminophen (TYLENOL) 500 MG tablet Take 500 mg by mouth every 6 (six) hours as needed.     . diphenhydrAMINE (BENADRYL) 25 MG tablet Take 1 tablet (25 mg total) by mouth every 6 (six) hours as needed. 30 tablet 0   . metoCLOPramide (REGLAN) 10 MG tablet Take 1 tablet (10 mg total) by mouth every 6 (six) hours as needed for nausea or vomiting (nausea/headache). 12 tablet 0   . Prenatal Vit-Fe Fumarate-FA (PRENATAL COMPLETE) 14-0.4 MG TABS Take 1 tablet by mouth daily. 90 tablet 0     Review of Systems  Constitutional: Negative for chills and fever.  Gastrointestinal: Positive for diarrhea, nausea and vomiting.   Physical Exam   Blood pressure 113/67, pulse 90, temperature 98.8 F (37.1 C), temperature source Oral, resp. rate 17, SpO2 100 %, unknown if currently breastfeeding.  Physical Exam Vitals and nursing note reviewed.  Constitutional:      General:  She is not in acute distress.    Appearance: Normal appearance.  HENT:     Head: Normocephalic and atraumatic.  Cardiovascular:     Rate and Rhythm: Normal rate.  Pulmonary:     Effort: Pulmonary effort is normal. No respiratory distress.  Abdominal:     General: There is no distension.     Palpations: Abdomen is soft. There is no mass.     Tenderness: There is abdominal tenderness in the epigastric area. There is no guarding or rebound.     Hernia: No hernia is present.  Musculoskeletal:        General: Normal range of motion.     Cervical back: Normal range of motion.  Skin:    General: Skin is warm and dry.  Neurological:     General: No focal deficit present.     Mental Status: She is alert and oriented to person, place, and time.  Psychiatric:        Mood and Affect: Mood normal.        Behavior: Behavior normal.    Results for  orders placed or performed during the hospital encounter of 04/16/20 (from the past 24 hour(s))  CBC     Status: Abnormal   Collection Time: 04/16/20  2:56 AM  Result Value Ref Range   WBC 13.0 (H) 4.0 - 10.5 K/uL   RBC 5.07 3.87 - 5.11 MIL/uL   Hemoglobin 13.1 12.0 - 15.0 g/dL   HCT 58.5 36 - 46 %   MCV 80.1 80.0 - 100.0 fL   MCH 25.8 (L) 26.0 - 34.0 pg   MCHC 32.3 30.0 - 36.0 g/dL   RDW 27.7 82.4 - 23.5 %   Platelets 314 150 - 400 K/uL   nRBC 0.0 0.0 - 0.2 %  Comprehensive metabolic panel     Status: Abnormal   Collection Time: 04/16/20  2:56 AM  Result Value Ref Range   Sodium 140 135 - 145 mmol/L   Potassium 3.3 (L) 3.5 - 5.1 mmol/L   Chloride 107 98 - 111 mmol/L   CO2 21 (L) 22 - 32 mmol/L   Glucose, Bld 99 70 - 99 mg/dL   BUN 10 6 - 20 mg/dL   Creatinine, Ser 3.61 0.44 - 1.00 mg/dL   Calcium 9.0 8.9 - 44.3 mg/dL   Total Protein 6.3 (L) 6.5 - 8.1 g/dL   Albumin 3.3 (L) 3.5 - 5.0 g/dL   AST 21 15 - 41 U/L   ALT 25 0 - 44 U/L   Alkaline Phosphatase 82 38 - 126 U/L   Total Bilirubin 0.6 0.3 - 1.2 mg/dL   GFR calc non Af Amer >60 >60 mL/min   GFR calc Af Amer >60 >60 mL/min   Anion gap 12 5 - 15  Lipase, blood     Status: None   Collection Time: 04/16/20  2:56 AM  Result Value Ref Range   Lipase 27 11 - 51 U/L    US ABDOMEN LIMITED RUQ  Result Date: 04/16/2020 CLINICAL DATA:  Vomiting and abdominal pain EXAM: ULTRASOUND ABDOMEN LIMITED RIGHT UPPER QUADRANT COMPARISON:  None. FINDINGS: Gallbladder: No gallstones or wall thickening visualized. No sonographic Murphy sign noted by sonographer. Common bile duct: Diameter: 4 mm Liver: No focal lesion identified. Within normal limits in parenchymal echogenicity. Portal vein is patent on color Doppler imaging with normal direction of blood flow towards the liver. Other: None. IMPRESSION: Normal appearing gallbladder and liver Electronically Signed   By: Kandis Fantasia  Avutu M.D.   On: 04/16/2020 03:07   MAU Course   Procedures  MDM Labs and imaging ordered and reviewed. No signs of acute process. No emesis while here. States feels better. Tolerating po. Instructed to eat bananas to increase K. Stable for discharge home.   Assessment and Plan   1. Gastroenteritis   2. Upper abdominal pain   3. Hypokalemia    Discharge home Follow up at [facility] as scheduled Rx Zofran Maintain hydration  Allergies as of 04/16/2020      Reactions   Zithromax [azithromycin] Hives   States was a child, told by mother Given with premed (Benadry/Vistaril) with no reaction   Augmentin [amoxicillin-pot Clavulanate] Hives   Omnicef [cefdinir] Hives   Penicillins Hives   Did it involve swelling of the face/tongue/throat, SOB, or low BP? Yes Did it involve sudden or severe rash/hives, skin peeling, or any reaction on the inside of your mouth or nose? No Did you need to seek medical attention at a hospital or doctor's office? Yes When did it last happen?pt was a child If all above answers are "NO", may proceed with cephalosporin use.   Flagyl [metronidazole] Rash      Medication List    STOP taking these medications   metoCLOPramide 10 MG tablet Commonly known as: Reglan     TAKE these medications   acetaminophen 500 MG tablet Commonly known as: TYLENOL Take 500 mg by mouth every 6 (six) hours as needed.   aspirin 81 MG chewable tablet Chew by mouth daily.   diphenhydrAMINE 25 MG tablet Commonly known as: BENADRYL Take 1 tablet (25 mg total) by mouth every 6 (six) hours as needed.   ondansetron 4 MG disintegrating tablet Commonly known as: Zofran ODT Take 1 tablet (4 mg total) by mouth every 8 (eight) hours as needed for nausea or vomiting.   Prenatal Complete 14-0.4 MG Tabs Take 1 tablet by mouth daily.      Donette Larry, CNM 04/16/2020, 3:41 AM

## 2020-04-16 NOTE — Discharge Instructions (Signed)

## 2020-04-16 NOTE — MAU Note (Signed)
Pt arrived via EMS. Pt is s/p vaginal delivery on 09/30 with complaint of onset of upper abd pain and forceful vomiting at 2330 tonight.

## 2020-09-21 IMAGING — US US OB COMP LESS 14 WK
1 series · 13 of 28 positions shown · non-contrast
Comparison: None available.

CLINICAL DATA: Initial evaluation for acute lower abdominal pain,
early pregnancy.

EXAM:
OBSTETRIC <14 WK US AND TRANSVAGINAL OB US
TECHNIQUE: Both transabdominal and transvaginal ultrasound examinations were
performed for complete evaluation of the gestation as well as the
maternal uterus, adnexal regions, and pelvic cul-de-sac.
Transvaginal technique was performed to assess early pregnancy.

[Series 1: us ob comp less 14 wk · 92 acquisitions, 13 frames shown]
[im 4/92]
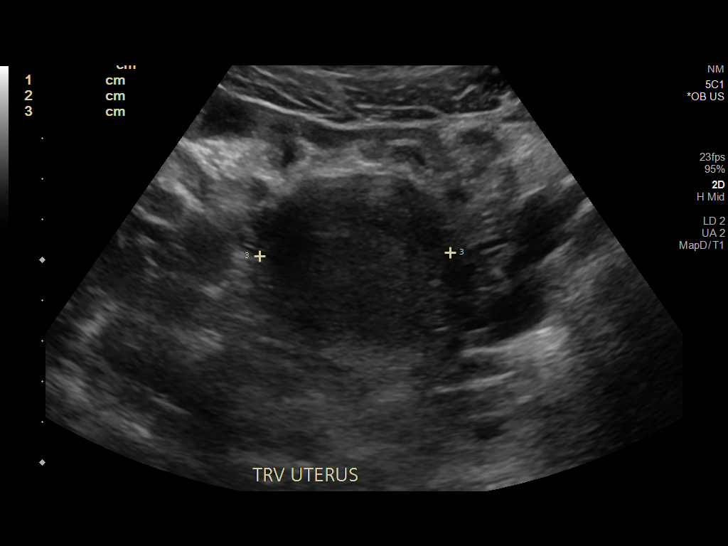
[im 11/92]
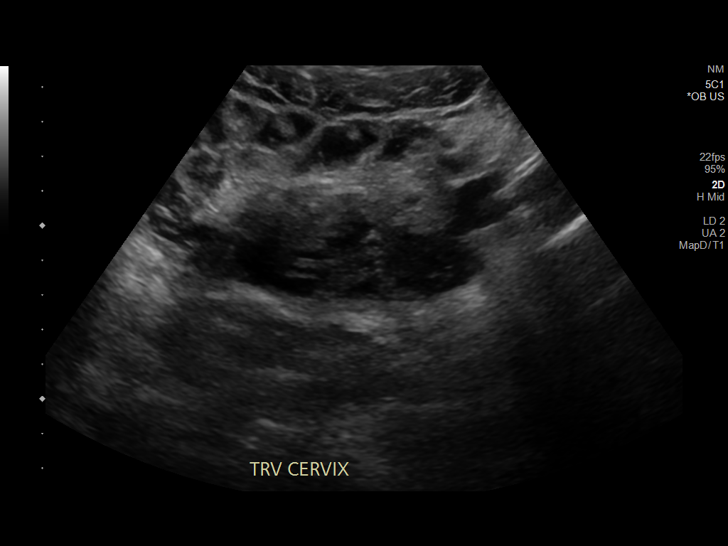
[im 17/92]
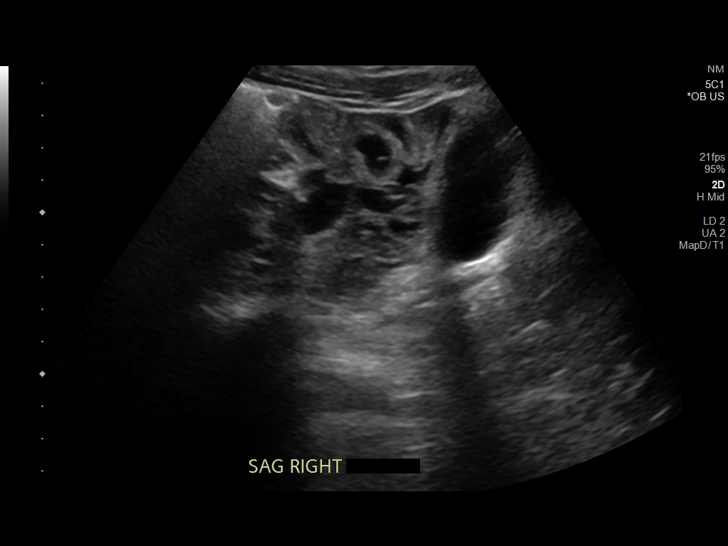
[im 24/92]
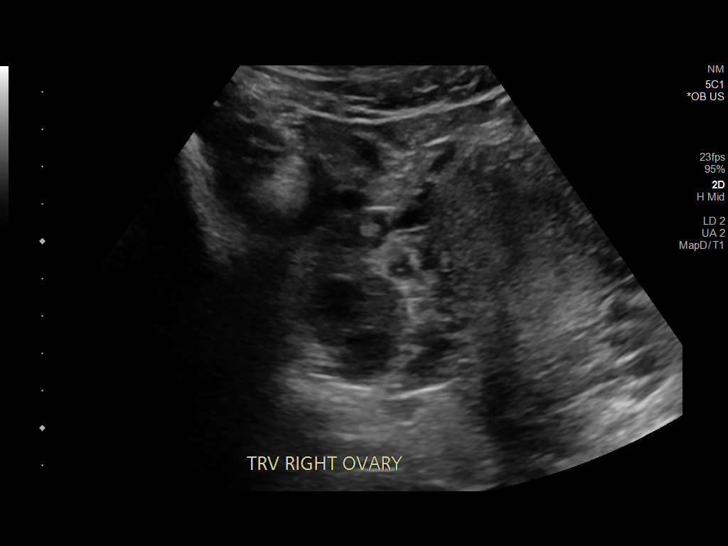
[im 31/92]
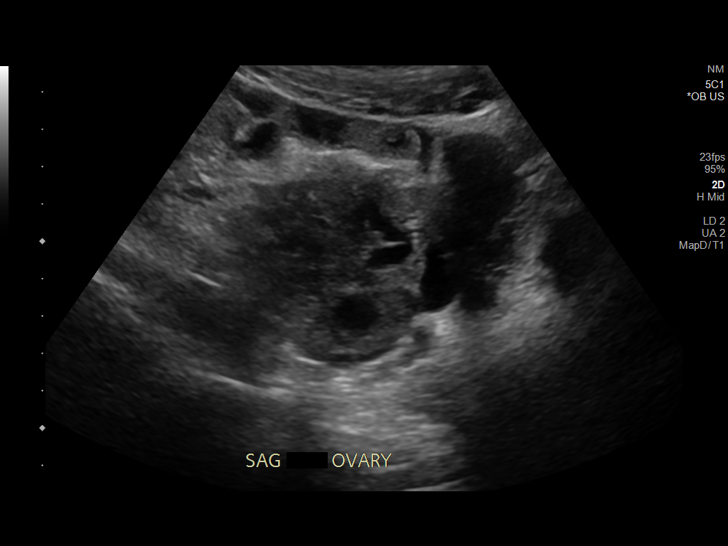
[im 38/92]
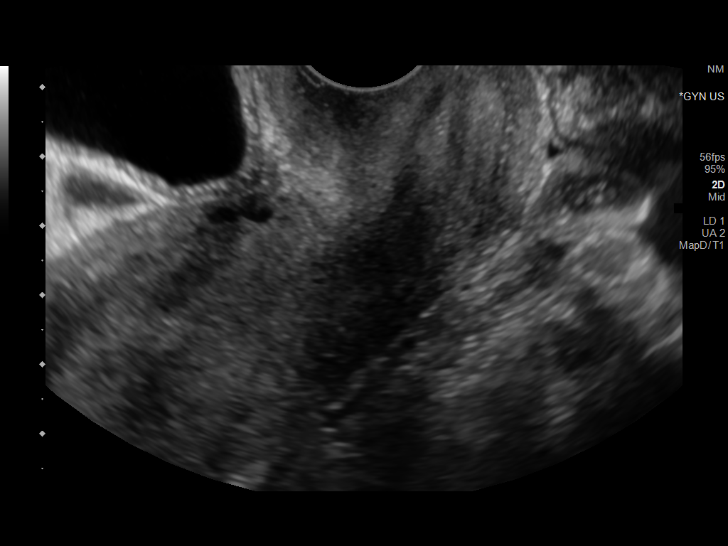
[im 48/92]
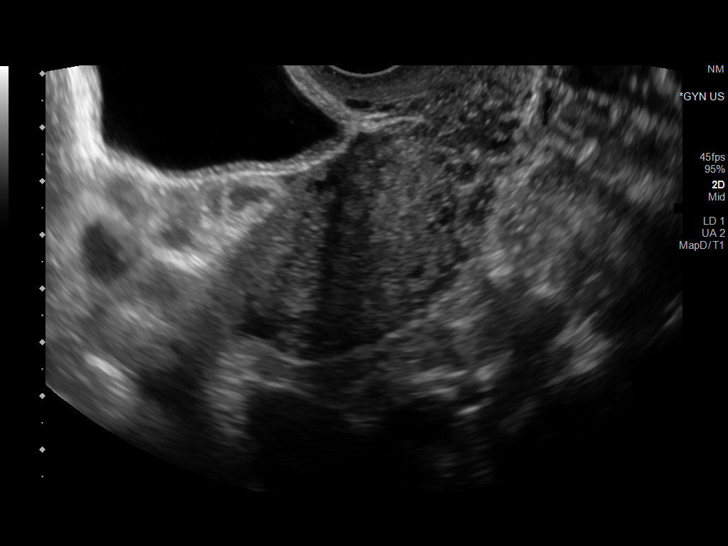
[im 54/92]
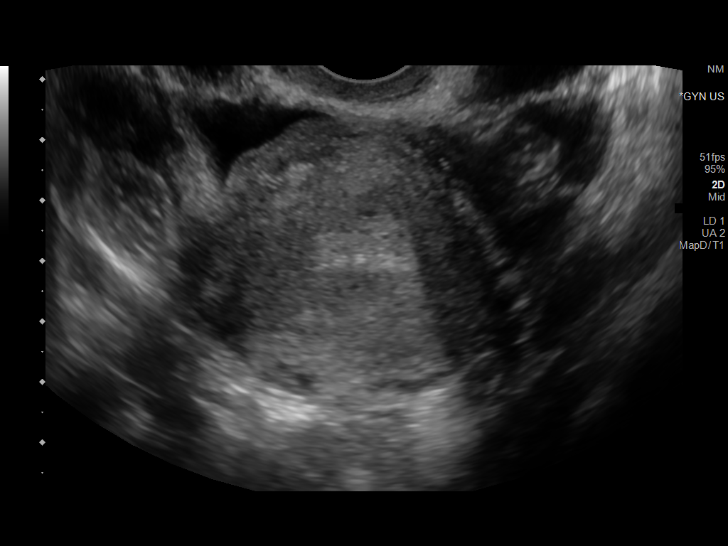
[im 61/92]
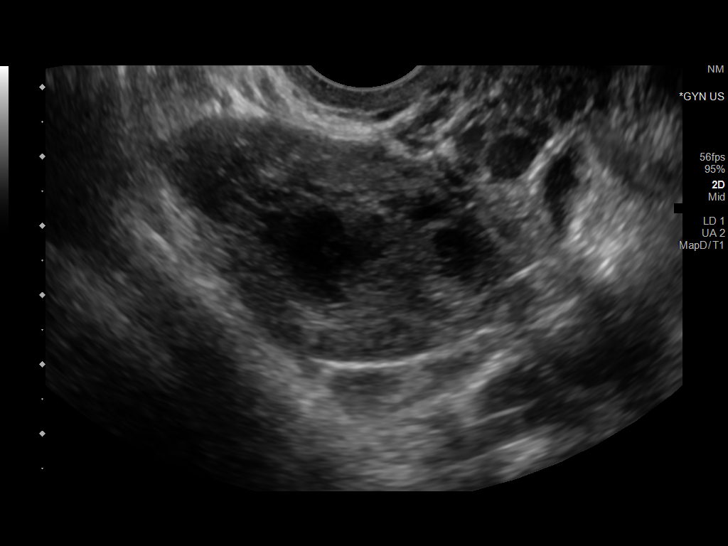
[im 68/92]
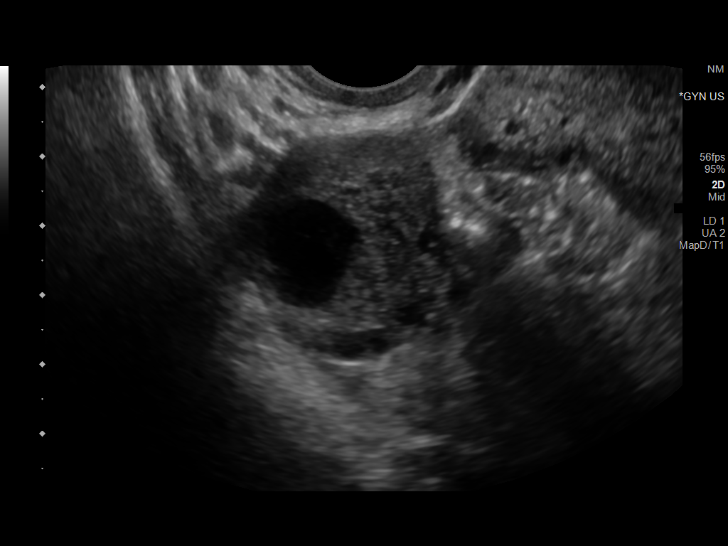
[im 75/92]
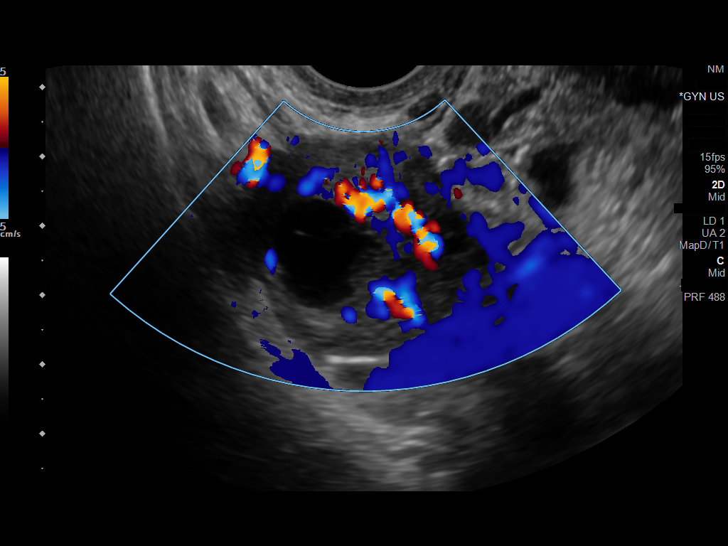
[im 81/92]
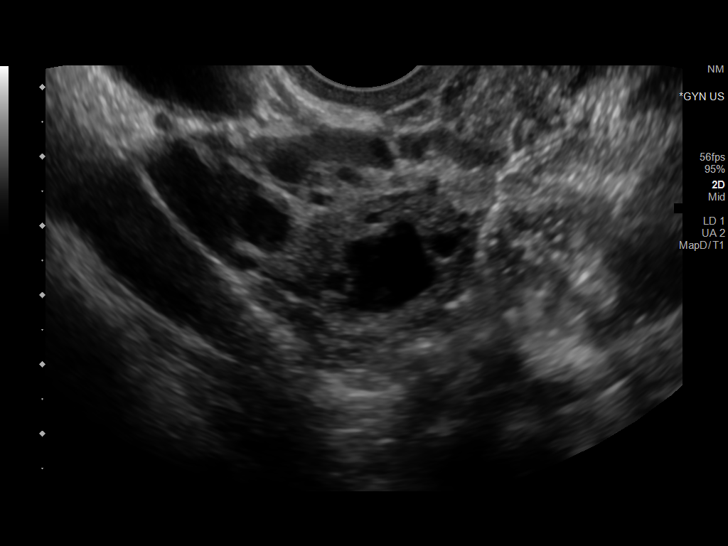
[im 88/92]
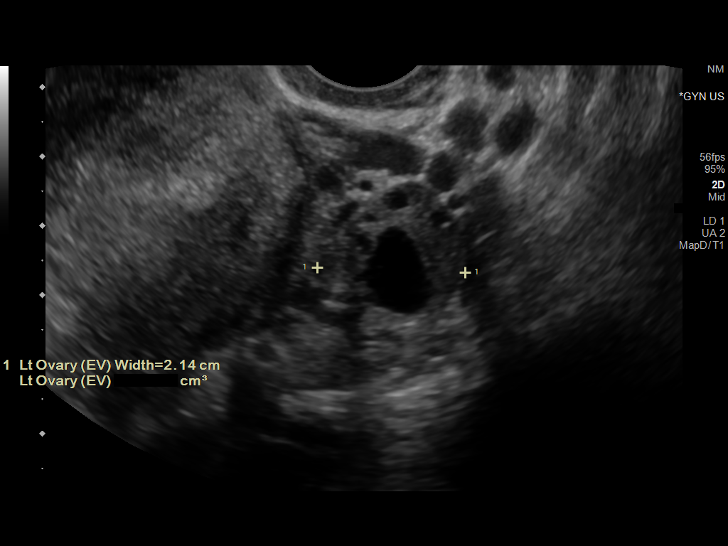

[13 of 28 positions shown; findings below may reference images not displayed]

FINDINGS: Intrauterine gestational sac: Negative. Endometrial stripe measures
6.4 mm in thickness.

Yolk sac:  Negative.

Embryo:  Negative.

Cardiac Activity: Negative.

Heart Rate: N/A  bpm

Subchorionic hemorrhage:  None visualized.

Maternal uterus/adnexae: Ovaries are normal in appearance
bilaterally. Small degenerating corpus luteal cyst noted within the
right ovary. Associated small volume free fluid within the pelvis.
IMPRESSION: 1. Early pregnancy with no discrete IUP or adnexal mass identified.
Finding is consistent with a pregnancy of unknown anatomic location.
Differential considerations include IUP to early to visualize,
recent SAB, or possibly occult ectopic pregnancy. Close clinical
monitoring with serial beta HCGs and close interval follow-up
ultrasound recommended as clinically warranted.
2. Small degenerating right ovarian corpus luteal cyst with
associated small volume free fluid within the pelvis. Findings could
contribute acute lower abdominal/pelvic pain.
3. No other acute maternal uterine or adnexal abnormality
identified.

## 2020-09-21 IMAGING — US US OB TRANSVAGINAL
1 series · 13 of 28 positions shown · non-contrast
Comparison: None available.

CLINICAL DATA: Initial evaluation for acute lower abdominal pain,
early pregnancy.

EXAM:
OBSTETRIC <14 WK US AND TRANSVAGINAL OB US
TECHNIQUE: Both transabdominal and transvaginal ultrasound examinations were
performed for complete evaluation of the gestation as well as the
maternal uterus, adnexal regions, and pelvic cul-de-sac.
Transvaginal technique was performed to assess early pregnancy.

[Series 1: us ob transvaginal · 91 acquisitions, 13 frames shown]
[im 4/91]
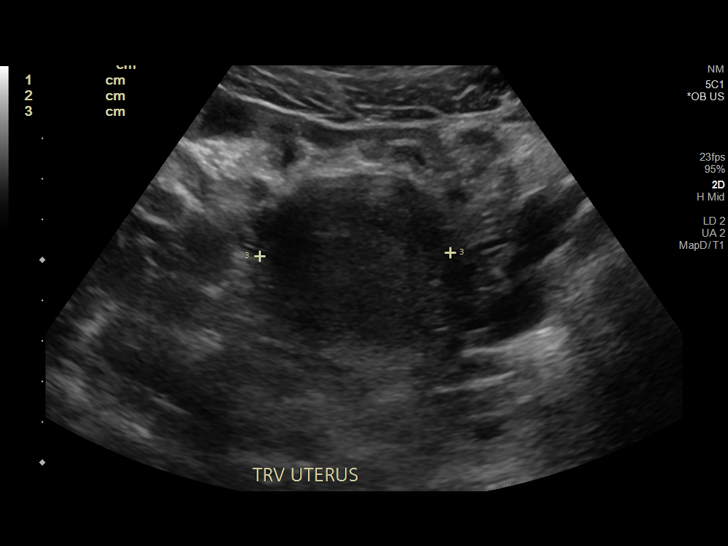
[im 11/91]
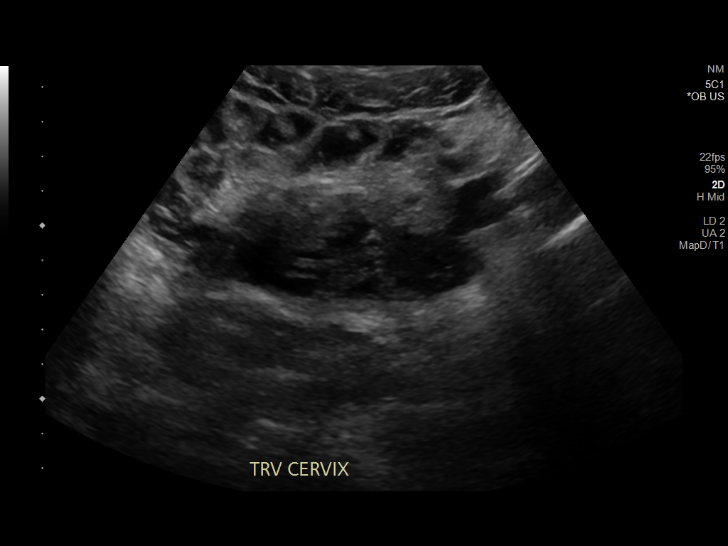
[im 17/91]
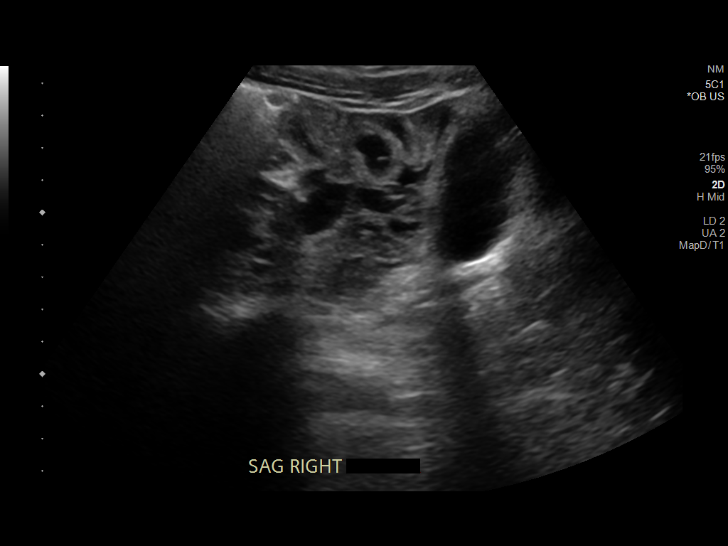
[im 24/91]
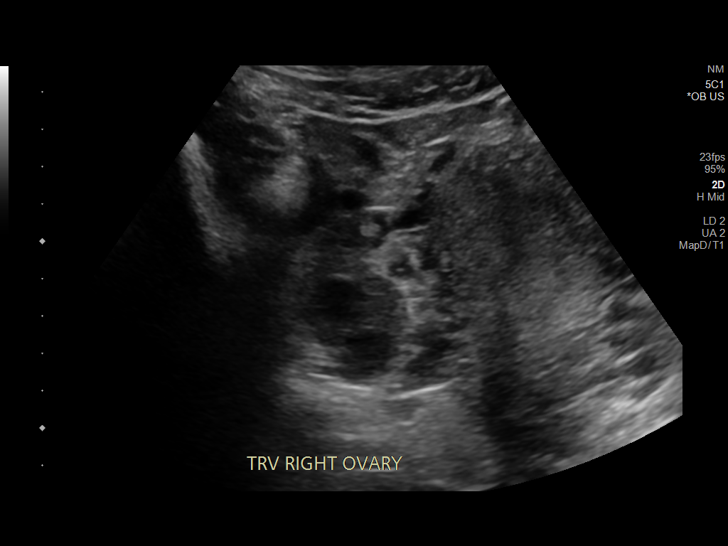
[im 31/91]
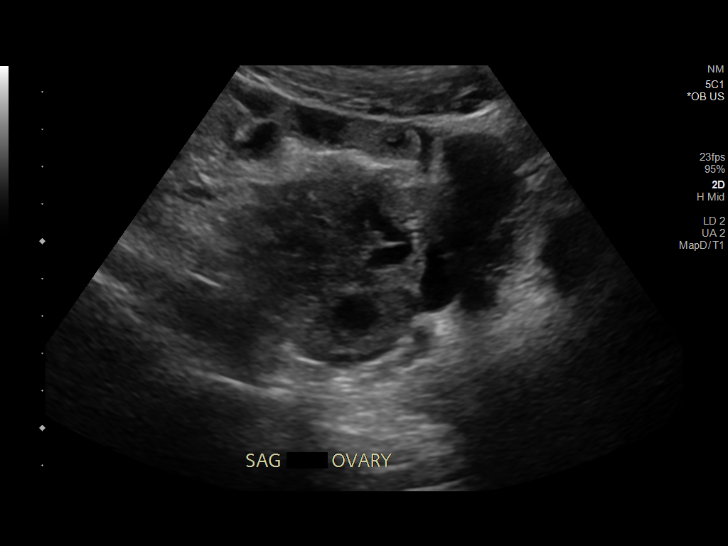
[im 37/91]
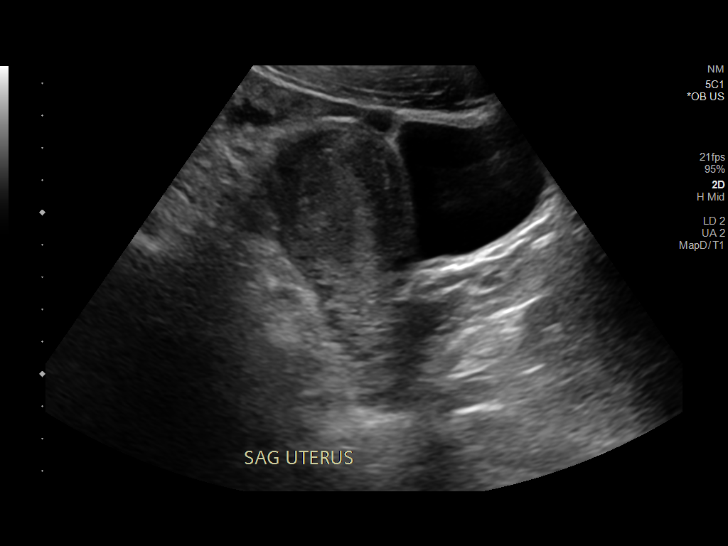
[im 47/91]
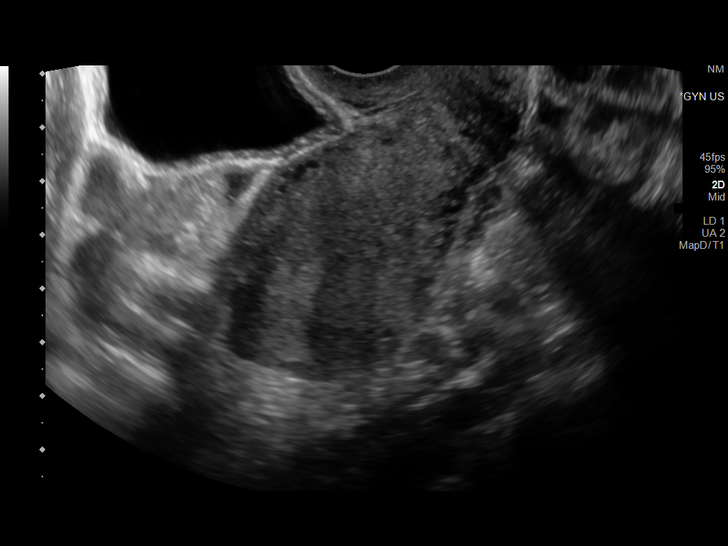
[im 54/91]
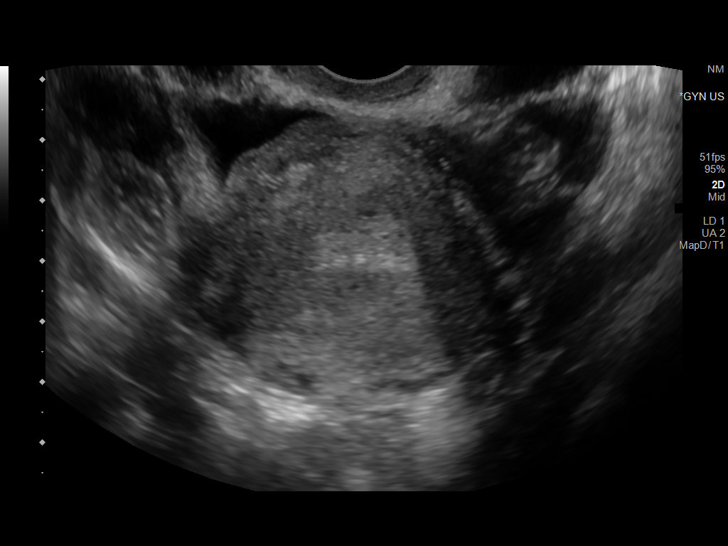
[im 61/91]
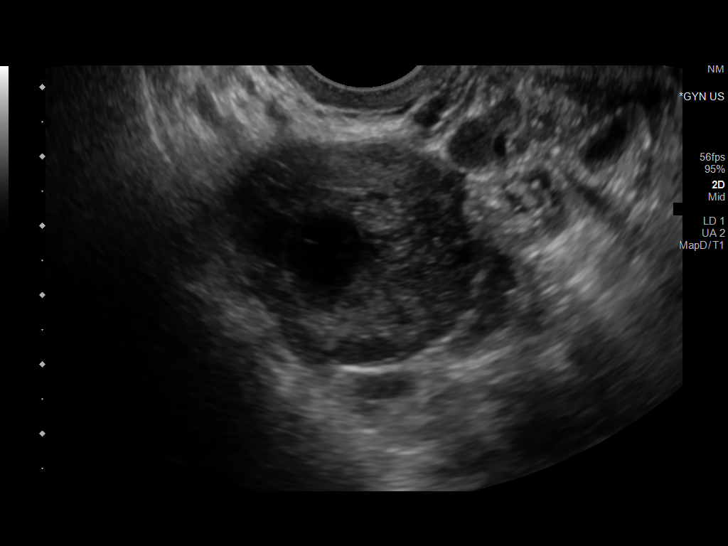
[im 67/91]
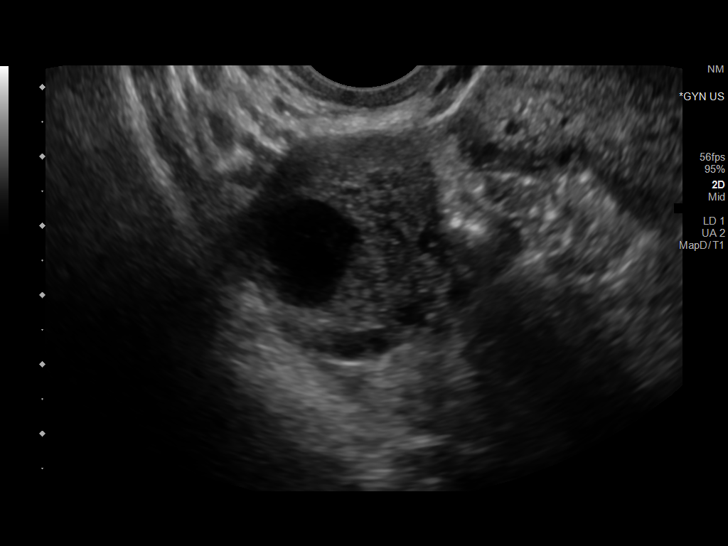
[im 74/91]
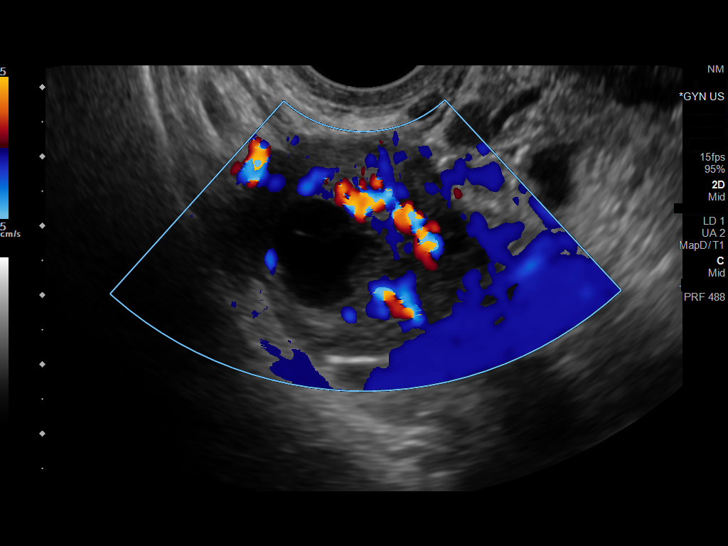
[im 81/91]
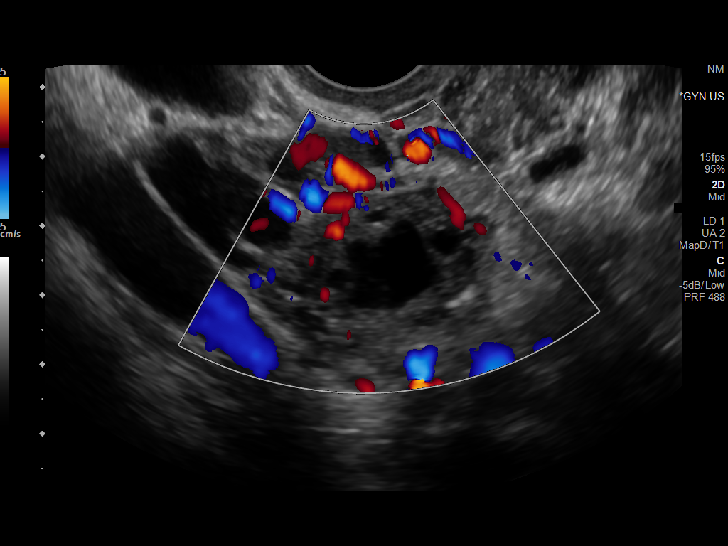
[im 87/91]
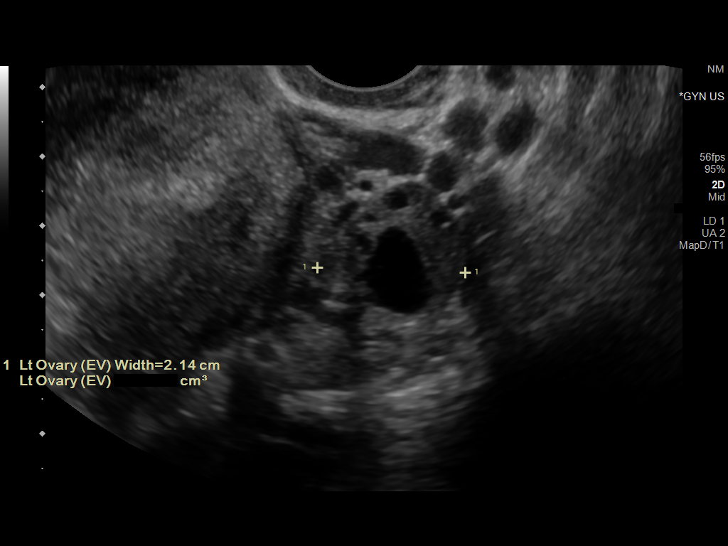

[13 of 28 positions shown; findings below may reference images not displayed]

FINDINGS: Intrauterine gestational sac: Negative. Endometrial stripe measures
6.4 mm in thickness.

Yolk sac:  Negative.

Embryo:  Negative.

Cardiac Activity: Negative.

Heart Rate: N/A  bpm

Subchorionic hemorrhage:  None visualized.

Maternal uterus/adnexae: Ovaries are normal in appearance
bilaterally. Small degenerating corpus luteal cyst noted within the
right ovary. Associated small volume free fluid within the pelvis.
IMPRESSION: 1. Early pregnancy with no discrete IUP or adnexal mass identified.
Finding is consistent with a pregnancy of unknown anatomic location.
Differential considerations include IUP to early to visualize,
recent SAB, or possibly occult ectopic pregnancy. Close clinical
monitoring with serial beta HCGs and close interval follow-up
ultrasound recommended as clinically warranted.
2. Small degenerating right ovarian corpus luteal cyst with
associated small volume free fluid within the pelvis. Findings could
contribute acute lower abdominal/pelvic pain.
3. No other acute maternal uterine or adnexal abnormality
identified.

## 2020-09-29 IMAGING — US US OB < 14 WEEKS - US OB TV
1 series · 13 of 28 positions shown · non-contrast
Comparison: None.

CLINICAL DATA: Bleeding

EXAM:
OBSTETRIC <14 WK US AND TRANSVAGINAL OB US
TECHNIQUE: Both transabdominal and transvaginal ultrasound examinations were
performed for complete evaluation of the gestation as well as the
maternal uterus, adnexal regions, and pelvic cul-de-sac.
Transvaginal technique was performed to assess early pregnancy.

[Series 1: us ob < 14 weeks - us ob tv · 62 acquisitions, 13 frames shown]
[im 3/62]
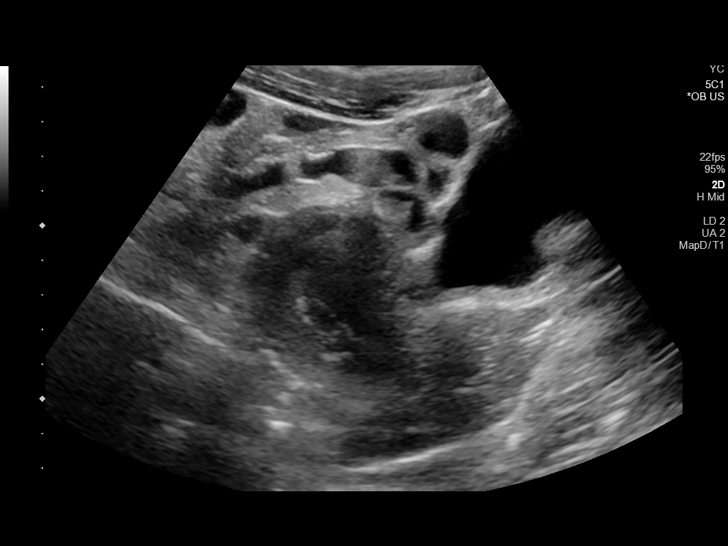
[im 7/62]
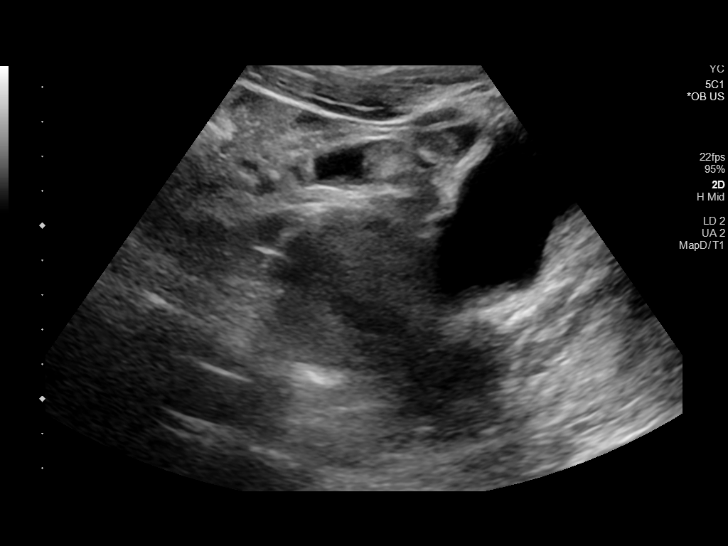
[im 12/62]
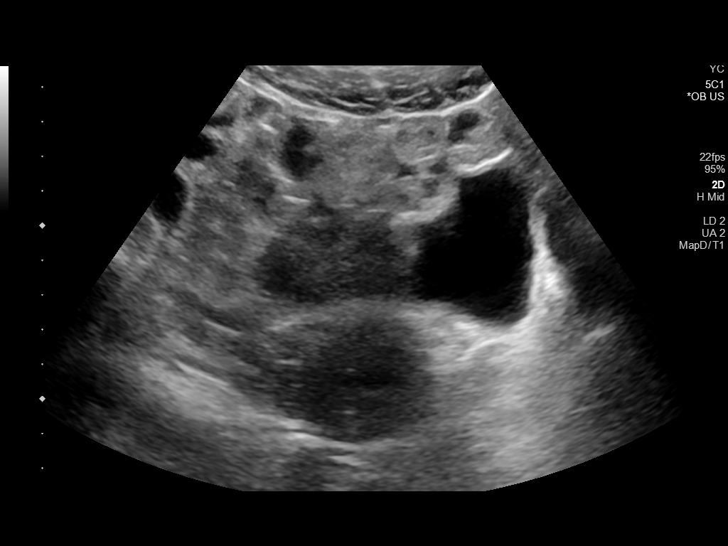
[im 16/62]
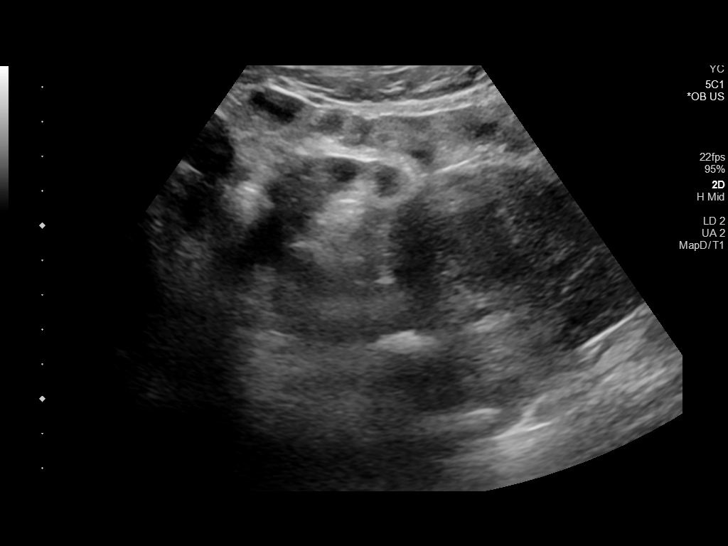
[im 21/62]
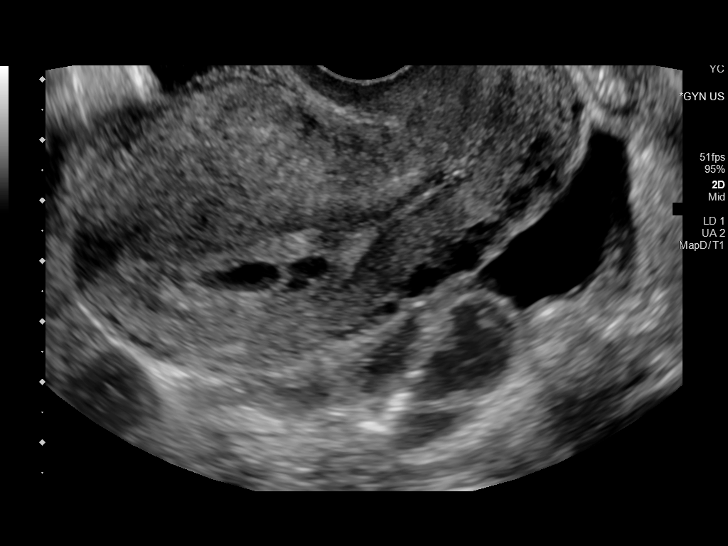
[im 25/62]
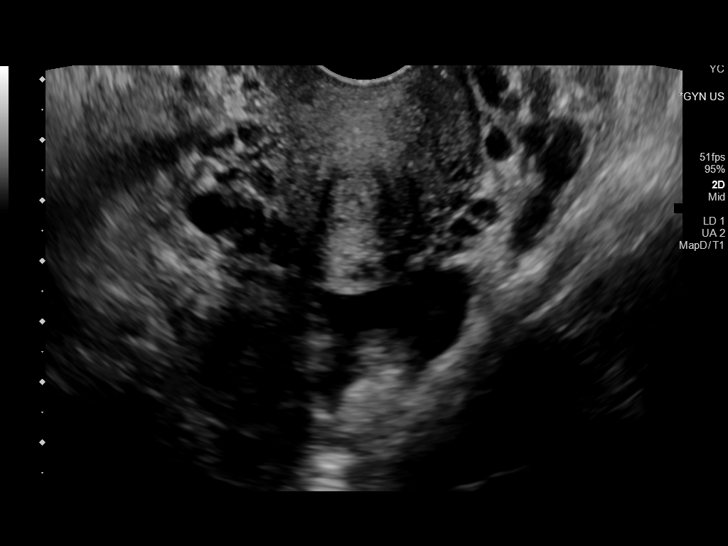
[im 32/62]
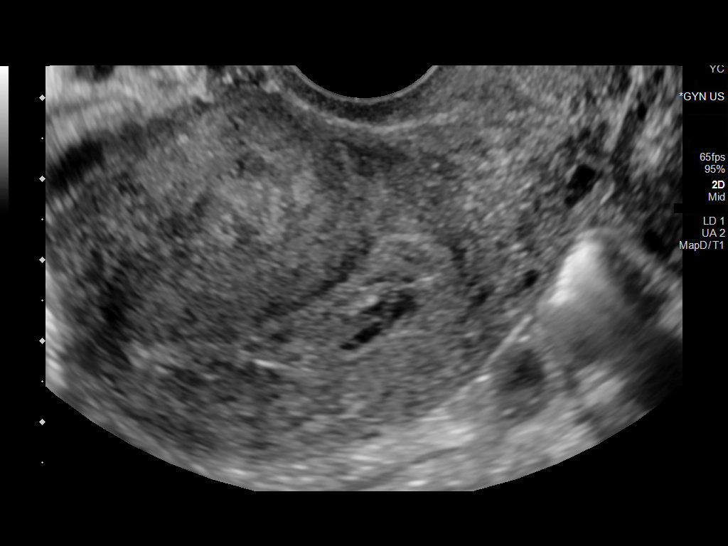
[im 37/62]
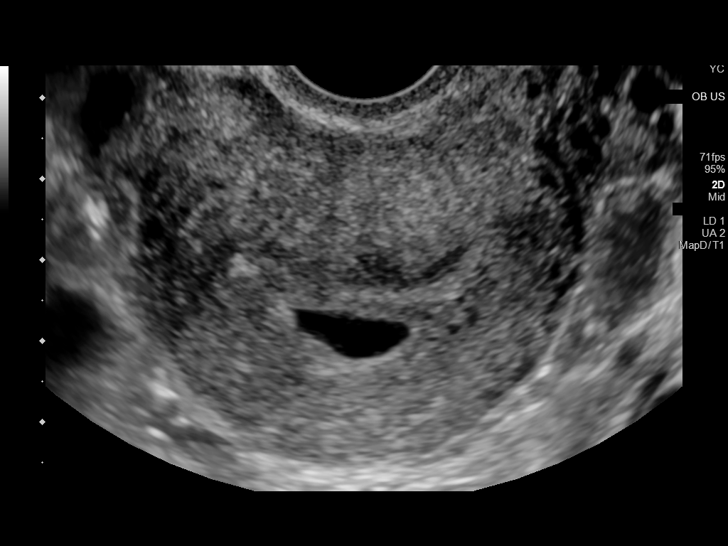
[im 41/62]
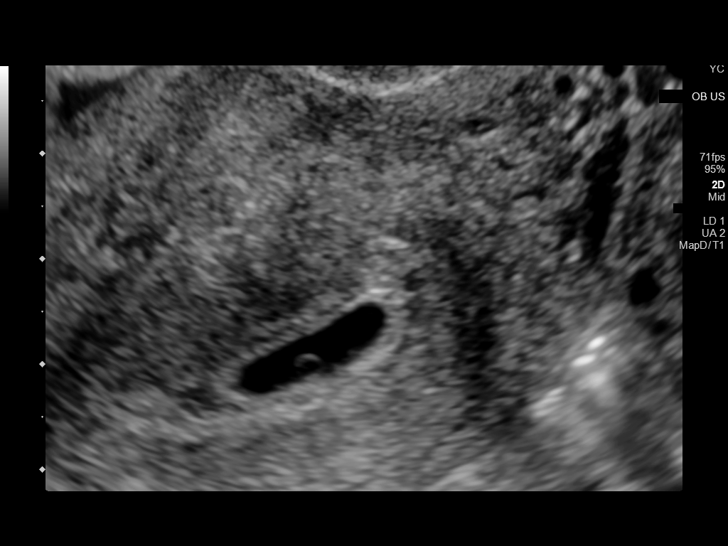
[im 46/62]
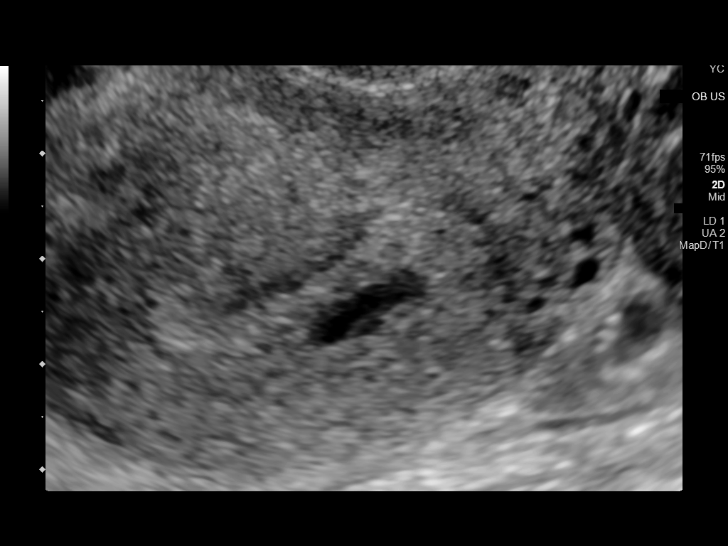
[im 50/62]
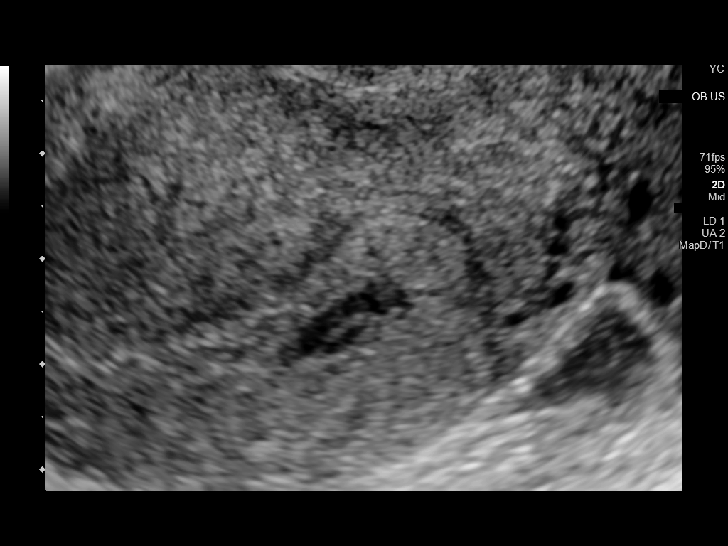
[im 55/62]
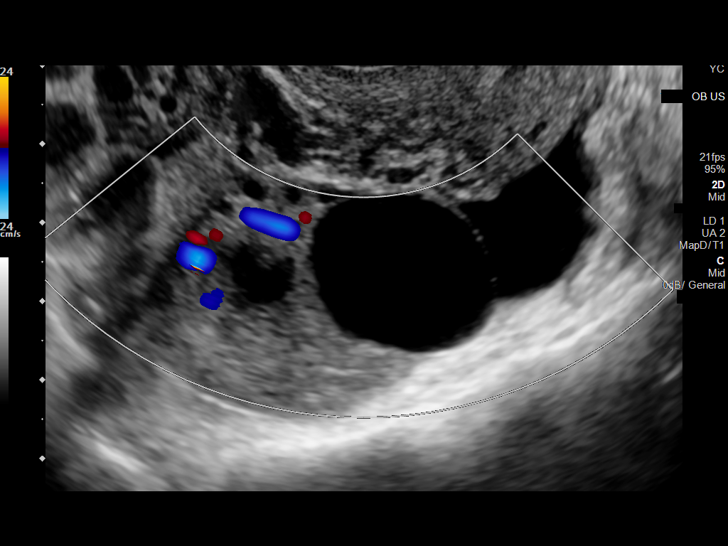
[im 59/62]
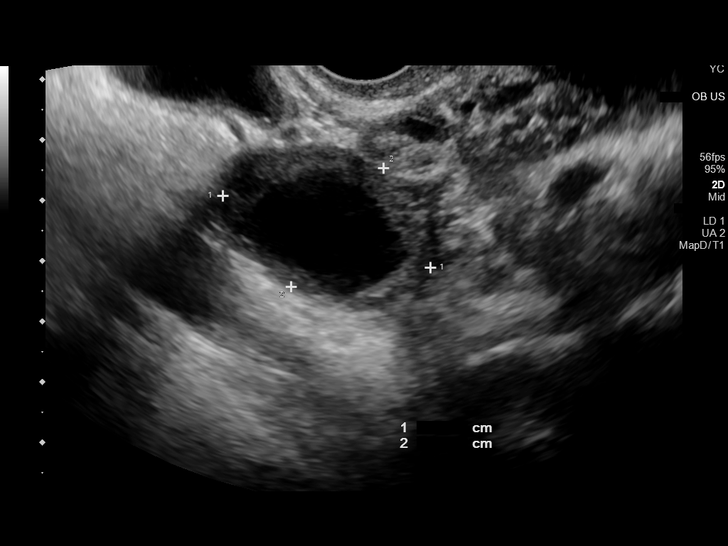

[13 of 28 positions shown; findings below may reference images not displayed]

FINDINGS: Intrauterine gestational sac: 2 possible gestational sacs are seen
within the uterus.

Yolk sac: Within gestational sac A there is a small round yolk sac.
No yolk sac seen within gestational sac B.

Embryo:  None

MSD: A:  12.1 mm   6 w   0 d

B: 7.6 mm, 5 weeks 4 days

Subchorionic hemorrhage:  None visualized.

Maternal uterus/adnexae: Normal appearing ovaries. There is probable
bilateral anechoic cyst seen.
IMPRESSION: Probable early intrauterine gestational sac (possibly 2 ), with
gestational sac A containing a yolk sac, but no fetal pole, or
cardiac activity yet visualized. Recommend follow-up quantitative
B-HCG levels and follow-up US in 14 days to assess viability. This
recommendation follows SRU consensus guidelines: Diagnostic Criteria
for Nonviable Pregnancy Early in the First Trimester. N Engl J Med

## 2020-10-08 ENCOUNTER — Other Ambulatory Visit (INDEPENDENT_AMBULATORY_CARE_PROVIDER_SITE_OTHER): Payer: Self-pay | Admitting: Internal Medicine

## 2020-10-08 DIAGNOSIS — I371 Nonrheumatic pulmonary valve insufficiency: Secondary | ICD-10-CM

## 2020-10-08 DIAGNOSIS — Z952 Presence of prosthetic heart valve: Secondary | ICD-10-CM

## 2020-10-08 DIAGNOSIS — Q213 Tetralogy of Fallot: Secondary | ICD-10-CM

## 2020-10-08 DIAGNOSIS — D561 Beta thalassemia: Secondary | ICD-10-CM

## 2020-10-08 DIAGNOSIS — R569 Unspecified convulsions: Secondary | ICD-10-CM

## 2020-10-08 DIAGNOSIS — H9190 Unspecified hearing loss, unspecified ear: Secondary | ICD-10-CM

## 2020-10-09 ENCOUNTER — Ambulatory Visit (INDEPENDENT_AMBULATORY_CARE_PROVIDER_SITE_OTHER): Payer: Self-pay

## 2020-10-09 ENCOUNTER — Encounter (INDEPENDENT_AMBULATORY_CARE_PROVIDER_SITE_OTHER): Payer: Medicaid Other | Admitting: Internal Medicine

## 2020-11-27 ENCOUNTER — Other Ambulatory Visit: Payer: Self-pay

## 2020-11-27 ENCOUNTER — Ambulatory Visit: Payer: Medicaid Other | Attending: Internal Medicine | Admitting: Internal Medicine

## 2020-11-27 ENCOUNTER — Encounter (INDEPENDENT_AMBULATORY_CARE_PROVIDER_SITE_OTHER): Payer: Self-pay | Admitting: Internal Medicine

## 2020-11-27 ENCOUNTER — Other Ambulatory Visit (INDEPENDENT_AMBULATORY_CARE_PROVIDER_SITE_OTHER): Payer: Self-pay | Admitting: Internal Medicine

## 2020-11-27 ENCOUNTER — Ambulatory Visit (HOSPITAL_BASED_OUTPATIENT_CLINIC_OR_DEPARTMENT_OTHER)
Admission: RE | Admit: 2020-11-27 | Discharge: 2020-11-27 | Disposition: A | Discharge status: Home / Self Care | Payer: Medicaid Other | Source: Ambulatory Visit

## 2020-11-27 VITALS — BP 116/68 | HR 78 | Temp 98.2°F | Ht 61.46 in | Wt 134.0 lb

## 2020-11-27 DIAGNOSIS — Z952 Presence of prosthetic heart valve: Secondary | ICD-10-CM

## 2020-11-27 DIAGNOSIS — H9190 Unspecified hearing loss, unspecified ear: Secondary | ICD-10-CM

## 2020-11-27 DIAGNOSIS — I371 Nonrheumatic pulmonary valve insufficiency: Secondary | ICD-10-CM

## 2020-11-27 DIAGNOSIS — D563 Thalassemia minor: Secondary | ICD-10-CM

## 2020-11-27 DIAGNOSIS — D561 Beta thalassemia: Secondary | ICD-10-CM

## 2020-11-27 DIAGNOSIS — Z953 Presence of xenogenic heart valve: Secondary | ICD-10-CM

## 2020-11-27 DIAGNOSIS — Q213 Tetralogy of Fallot: Secondary | ICD-10-CM

## 2020-11-27 DIAGNOSIS — R569 Unspecified convulsions: Secondary | ICD-10-CM

## 2020-11-27 DIAGNOSIS — K623 Rectal prolapse: Secondary | ICD-10-CM | POA: Insufficient documentation

## 2020-11-27 DIAGNOSIS — R2990 Unspecified symptoms and signs involving the nervous system: Secondary | ICD-10-CM

## 2020-11-27 DIAGNOSIS — Z6824 Body mass index (BMI) 24.0-24.9, adult: Secondary | ICD-10-CM

## 2020-11-27 DIAGNOSIS — Q211 Atrial septal defect: Secondary | ICD-10-CM | POA: Insufficient documentation

## 2020-11-27 DIAGNOSIS — Z8774 Personal history of (corrected) congenital malformations of heart and circulatory system: Secondary | ICD-10-CM

## 2020-11-27 NOTE — Progress Notes (Signed)
[facility]   ACHD Clinic Note            Date:              11/27/2020  Name:            [name]  Age:               20 y.o.  DOB:            2001-01-11     Chief Complaint:   Chief Complaint   Patient presents with   . Tof        History of Present Illness:     I was asked to see this patient by Primary Care Provider Frances Furbish, DO to consult regarding cardiac issues.  Patient is a 20 y.o.  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    [name] has been seen by Dr. 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    She was born at 36 weeks 6 days GA. When she was born there were no signs of something wrong. However, at 32 old she had an episode of Cyanosis. Diagnosis was obtained.  Eckert who placed a heart stent at nine months of age.  At 20 years of age they placed a 15 gauge tissue valve.   At the age of 50 she had open heart surgery where they replaced her pulmonary valve with a pig valve.  Her mother reports that with each valve surgery they expected the valve to last ~10 years. They have consistently lasted for 7 years before needing to be replaced.    [name] was also told that she would be unable to have children, however, she now has two children. The only issue with delivery was very low blood pressure. Both pregnancies were Vaginal and Induced.  For the last portion of her pregnancy with her second child she required constant monitoring. Her children are both from different fathers and they are semi-involved with the children.  This year in 2022  She went to the hospital last weekend due to issue with the whole left side of her body feeling heavy and limp. She called an ambulance immediately after calling her mother, the EMT's noted signs of a stroke. She was taken to [facility] where they reported that she did not have a stroke but said that she should be seen by a cardiologist as soon as  possible.   On Sunday, [name] started to have nose bleeds before going to bed. Upon laying down, she began to lose sensation in the left side of her body and developed a headache. She went to the ED very soon after. While at the ED the doctors performed a CT scan, they did not provide Devera with any information about results. They only suggested that she be seen in [place].   She did not have any similar issues prior to this episode. It took her around 3 hours to return to her normal self.    [name] had her first migraine 6 years ago. They are always focused on the left side of her head and the left side of her neck. She generally requires the lights to be off and minimal noise to get over these headaches. [name] has not been seen by Neurology in the past.    Her blood pressure normally runs between 118 and 124 mmHg.    [name]  experiences shortness of breath while walking very short distances.  Her brother and uncle both have asthma.    She worked at [employer] for three months and began to experience dizzy spells, SOB and weakness while at work.    She has no activity restriction, ADL,  No history of asthma. gained/ lost weight   Has 7 siblings who are healthy.  Has good appetite and sleeps well.   Patient and mother deny:  Diaphoresis, Respiratory Distress, Exercise Intolerance, Chest Pain, Palpitations, Syncope, Edema.    She is c/o of not being able to do his regular activities as he used to do, now she has difficulty in her way back, she feels tired and weak with heavy extremities as well as SOB.  She does not sleep well, she takes melatonin to help her.    She has a small appetite.   She does not feel depressed.  Never smoker    The c/o rectal prolapse     Past Medical /Surgical History:      Past Medical History:   Diagnosis Date   . Attention deficit disorder 08/26/2011   . Chest pain     Has had negative nonnuclear stress tests in October of 2008 and March 2001. Last Holter February 13 demonstrated 13  PVC's with one couplets and normal low and high rates.    . Constipation     Has been seen multiple times by GI for this.    . Fallot tetralogy    . Fever of unknown origin 08/30/2007   . GERD (gastroesophageal reflux disease)    . Headache    . Heart disease    . Impaired hearing     Past history of secretory otitis.    . Otitis media    . Piercing    . Psychiatric problem    . Rash     Eczema in scalp   . Seizure (CMS [facility])     Treated in the past for seizures and followed by a neurologist.   . STD (sexually transmitted disease) 07/2015    chlamydia   . Thalassemia trait    . Unspecified deficiency anemia     Hemoglobin electrophoresis has shown 87% A, 3.5% A2 and 9% fetal in past.  Patient eats one meal a day. She eats ice.          Past Surgical History:   Procedure Laterality Date   . HX HEART SURGERY     . PB INJECT W CARD CATH LV/LA ANGIO     . PB REPLACEMENT, PULMONARY VALVE     . PB REPR ASD W BYPASS     . PB REPR TET FALLOT W PULM ATRESIA           Family History:       Family Medical History:     Problem Relation (Age of Onset)    ADHD/ADD Other    Congestive Heart Failure Other    Mental Retardation Other    Multiple Sclerosis Other    SLE Other    Seizures Brother        No family history of Congenital Heart Defects.  No family history of sudden cardiac deaths.  +Family history of Migraines in her mother.  +Family history of Asthma    Social History:     Social History   . Foster child Yes    . Smoke exposure No    . Patients' primary caregiver? Dr. Deveron Furlong    .  Lives with Biologic Parent No           Medications       Current Outpatient Medications:   .  aspirin (ECOTRIN) 81 mg Oral Tablet, Delayed Release (E.C.), Take 81 mg by mouth Once a day (Patient not taking: Reported on 11/27/2020), Disp: , Rfl:   .  docusate sodium (COLACE) 100 mg Oral Capsule, Take 1 Cap (100 mg total) by mouth Twice daily (Patient not taking: Reported on 08/16/2017), Disp: 60 Cap, Rfl: 2  .  hydrocortisone 2.5 % Apply externally  cream with perineal applicator, by Rectal route Twice daily (Patient not taking: Reported on 08/16/2017), Disp: 28.35 g, Rfl: 0  .  hydrocortisone acetate (ANUSOL-HC) 25 mg Rectal Suppository, 1 Suppository (25 mg total) by Rectal route Twice per day as needed For internal hemorrhoids (Patient not taking: Reported on 08/16/2017), Disp: 12 Suppository, Rfl: 0  .  hydrOXYzine HCl (ATARAX) 25 mg Oral Tablet, Take 1 Tab (25 mg total) by mouth Three times a day as needed for Itching (Patient not taking: Reported on 11/27/2020), Disp: 60 Tab, Rfl: 1  .  Ibuprofen (MOTRIN) 600 mg Oral Tablet, Take 1 Tab (600 mg total) by mouth Four times a day as needed for Pain (Patient not taking: Reported on 11/27/2020), Disp: 60 Tab, Rfl: 2  .  prenatal vitamin-iron-folate Tablet, Take 1 Tab by mouth Once a day (Patient not taking: Reported on 11/27/2020), Disp: , Rfl:   .  raNITIdine (ZANTAC) 150 mg Oral Tablet, Take 1 Tab (150 mg total) by mouth Twice daily For acid reflux (Patient not taking: Reported on 11/27/2020), Disp: 60 Tab, Rfl: 2    Review of Systems:      CONSTITUTIONAL:  Negative   EYES:  Negative  ENT: Negative   CARDIOVASCULAR:  see HPI  RESPIRATORY:  see HPI  NEUROLOGICAL:  +Migraines w/ aura.  GI:  +Rectal Prolapse. Constipation (controlled using Miralax).  GU: Negative for frequency  ENDOCRINE:  Negative  SKIN:  Negative   MUSCULOSKELETAL:  Negative   HEM/LYMPH:  Negative   ALLERGIC/IMM:  Negative  PSYCHIATRIC:  Negative for bad mood     Physical Examination:     Vitals:    11/27/20 1200 11/27/20 1203   BP: (!) 106/58 116/68   Pulse: 80 78   Temp: 36.8 C (98.2 F) 36.8 C (98.2 F)   TempSrc: Thermal Scan Thermal Scan   SpO2: 98% 98%   Weight: 60.8 kg (134 lb 0.6 oz) 60.8 kg (134 lb 0.6 oz)   Height: 1.561 m (5' 1.46") 1.561 m (5' 1.46")   BMI: 25 25          CONSTITUTIONAL: Non dysmorphic   GENERAL: Patient is in no acute distress, no cyanosis or jaundice.   HEAD: Head is normocephalic, atraumatic. No palpable  masses.  EYES: PERRL, no conjunctival injection, no pallor or icterus  ENT: NC/AT, no rhinorrhea, normal appearing nasal septum, normal turbinates, normal ear position, OP clear, MMM, no significant dental carries.   NECK:  Supple, Trachea is midline. No masses are palpated.  LYMPH: No lymphadenopathy palpable in the neck.  RESPIRATORY:  Symmetric chest expansion, easy work of breathing, clear to auscultation bilaterally, no retraction, no wheezing rhonchi or crackles.    CARDIOVASCULAR:  Normoactive precordium, no heave, no JVD, regular rhythm, Nml S1, Nml and physiologically split S2; No gallop or clicks, no murmur or rub. 2+ and equal radia/femoral/ DP pulses bilaterally. No radiofemoral delay, CR`2secs, no extremities edema or  cyanosis  GI: Soft, non tender, non distended, no significant hepatosplenomegaly.   NEUROLOGICAL: No focal deficit, grossly normal strength and tone.   SKIN: Skin is warm and dry to touch. no rashes  MUSCULOSKELETAL: Extremities move equally well.no chest wall tenderness.   PSYCHIATRIC: Patient is pleasant, cooperative and alert.      Labs      I personnally reviewed the labs of the patient, will need new labs.      Data Reviewed:     ECG: 11/27/2020: reviewed and interpreted by myself:  Sinus rhythm, Left axis deviation, RBBB with secondary T wave inversion   ECHO 11/27/2020: supervised and reviewed by myself; my interpretation is as follows:   Pt with TOF with absent pulmonary valve syndrome by hx.  No residual intracardiac shunt.  Good s function of both ventricles.  Only mild RV Dilatation.  There is a bioprosthetic pulmonary valve (#29 porcine) with thickened leaflets that are functioning well. Mild gradient  across the valve, no significant regurgitation. . PS gradient is 20 mm hg peak and 12 mm Hg mean.    MRI:  CTA:   CPT:  Holter:   Event:   Cath:    ACHD AP classification:    CHD anatomy:  I.  Simple: small ASD, VSD, mild PS, previous PDA ligation. repaired ASD & VSD no residue    II Moderate: repaired or unrepaired:   Ao/LV fistula; APVR; Anomalous coronary; AVSD p/c; AoVD v/subv; MVD; coarc; Ebstein; Mod to Large unrepaired ASD/PDA/VSD ;PR/PS mode/sev; Sinus of V fistula/aneur; RTOF; straddling AVV  III Complex: cyanotic unrepaired or palliated; DORV; Fontan; IAA, M atresia; P atresia; TGA D/L; SV; TA; Isomerism   Physiologic stage:  A:  NYHA I; no hemodynamic or anatomic sequale; no arhythmia; Normal ex; Normal R/H/P function   B: NYHA II; Mild hemodyn mild Ao dil, mild vent dysf, mild vent dil; mild val disease; Arrhythmia no ttt; objective limitation to ex.   C: NYHA III; > mod valve disease; . mod vent dysf; Mod AO dil, mild/mod hypoxia; Vein or art stenosis;> mod shunt; mod PH; arrhythmia on ttt; end organ dysf responsive to ttt  D: NYHA IV; sev ao dil; arrhythmia refractory to ttt; sev PH; sev Hypoxia; Eisem; refractory end organ dysf.    Patient is class II stage A-B      Assessment and Plan:      Franne FortsJatoria Deajanea Etheridge 20 y.o.  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 She underwent complete correction of the defect on July 27, 2000, with Dacron patch closure of a large malalignment ventricular septal defect, infundibular muscle resection and transannular right ventricular outflow tract pericardial patch, as well as primary suture closure of a moderate-sized secundum atrial septal defect.   She later underwent pulmonary valve replacement with a #27 Carpentier-Edwards pericardial bioprosthetic valve and bovine pericardial patch angioplasty of the main pulmonary artery on August 18, 2007.  On 02/01/2014 she underwent excision of the old #27 Carpentier-Edwards bioprosthetic pulmonary valve, pulmonary valve replacement with a #29 Medtronic Mosaic porcine bioprosthetic pulmonary valve, bovine pericardial patch angioplasty of the main pulmonary artery at the pulmonary valve insertion site.   She gave birth to 2 children who are healthy.  She did not have  problems and did well after its own.  From the card point of view she is still doing well she works at [employer] and does not go back medication apart the aspirin.  Her pulmonary valve is  well function she and with no significant stenosis or regurgitation  Very recently the week before this visit she had an episode of weakness of the lies left side of the body as well as no nosebleed has and felt weakness or lower the left side of the body that lasted for 3-4 hours then resolved she had a is a done in the ED that said with no abnormality.  She also gives history of rectal prolapse.  For the heart:  Will obtain a CTA for better assessment of the an atrial septum and the pulmonary arteries.  His the atrial septum was not well visualized and the small PFO cannot be ruled out will do a bubble study in her next echo.  will continue to follow and in the future will obtain an MRI MRA for better assessment of the ventricular function as well as the pulmonary valve.  For now will consult Neurology for the most recent complaint.  There are no clots or shunts at the level of the heart   For her rectal prolapse will consult General surgery  Follow-up in 6 months             PLAN:    CTA heart now  Refer to neurology  Refer her to General surgery  F/U in 6 months  Medications: Aspirin   No Activity Restrictions  She needs SBE Prophylaxis according to most recent AHA guidelines.   No contraindications for any interventional procedures or need for anesthesia from the cardiac stand point       I have discussed the plan with the family. All their question has been answered. Discharge Instructions were emphasized to the family by myself and the clinical staff.  Her mother will call if any problem/issues arise.   I spent more than 50 % of this 60 min visit face to face with the family counseling and coordinating care.   I was supervising the echo and showed pictures to the family. I drew diagrams to better explain the pathology and  intervention. I explained the plan and the steps to follow.   I discussed life style modification healthy diet, decrease weight, decrease salt and exercise and the benefit of each on the cardiovascular system.     I am scribing for, and in the presence of, Dr. Claudine Mouton for services provided on 11/27/2020  Burnard Bunting, SCRIBE  11/27/2020, 08:55          *tgscribe*    Jaclyn Prime, MD  Assistant Professor   Pediatric Cardiology   ACHD Program director  [place]  Note: This documentation was completed with the assistance of voice recognition software which may result in unintentional documentation errors.

## 2020-12-02 LAB — PEDIATRIC TRANSTHORACIC ECHO (IN CLINIC)
LA Volume Index: 14.7 mL/m2
Left Atrium Major Axis: 4.87 cm

## 2020-12-10 ENCOUNTER — Encounter (INDEPENDENT_AMBULATORY_CARE_PROVIDER_SITE_OTHER): Payer: Self-pay | Admitting: Internal Medicine

## 2020-12-15 LAB — ECG 12-LEAD - PEDS (FOR USE IN PED CARD CLINIC ONLY)
Atrial Rate: 77 {beats}/min
Calculated P Axis: 27 degrees
Calculated R Axis: -50 degrees
Calculated T Axis: 72 degrees
PR Interval: 164 ms
QRS Duration: 138 ms
QT Interval: 402 ms
QTC Calculation: 454 ms
Ventricular rate: 77 {beats}/min

## 2021-01-22 ENCOUNTER — Other Ambulatory Visit (HOSPITAL_COMMUNITY): Payer: Self-pay

## 2021-02-13 NOTE — H&P (Signed)
COLORECTAL SURGERY HISTORY and PHYSICAL    Patient: [name]  D.O.B.: 01/05/2001  MRN# [id number]  Date of Service: 02/13/2021    Referring Provider: Jaclyn Prime, MD      Chief Complaint: rectal prolapse    HPI  [name] is a 20 y.o. female presents for evaluation of rectal prolapse.    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 She presented to the hospital in May with stroke-like symptoms. She was told that she did not have a stroke but should be seen by cardiology ASAP. At her visit with them, she reported DOE and dizzy spells. She also reported rectal prolapse and was referred to CRS. She was to complete a CTA and see neurology. She lives 3 hours away and has not completed them yet.    She and her mother report a long standing hx of chronic constipation since childhood. She's tried multiple OTC medications including laxatives, fiber, and stool softeners. She has seen GI but it's been several years. She states that she had a balloon test but no imaging or colonoscopy. She's not currently taking any bowel regimen. She has a BM once every few weeks. These are firm and small. She sits on the toilet for 30 mins and endorse straining. She has tissue protruding from her anus with BMs and heavy lifting. She has to manually reduce this 90% of the time. She does endorse recent bleeding from the area but no hematochezia, melena, or blood dripping into the toilet. She denies any FI of stool or gas. She drinks 64 oz of water daily and is active. She does have 2 children via vaginal birth. Her protrusion has gotten worse since her second child. She denies any abdominal, n/v, fevers, or chills but does endorse pain with BM since childhood.     Major medical problems: ADHD, angina, constipation, fallot tetralogy, GERD, HA, seizure, thalassemia trait      Allergies:  No Known Allergies    Current Outpatient Prescriptions  Outpatient Medications  Marked as Taking for the 02/20/21 encounter (Office Visit) with Raenah Murley, Caryn Bee, MD   Medication Sig   . hydrocortisone 2.5 % Apply externally cream with perineal applicator by Rectal route Twice daily   . Ibuprofen (MOTRIN) 600 mg Oral Tablet Take 1 Tab (600 mg total) by mouth Four times a day as needed for Pain   . valACYclovir (VALTREX) 1 gram Oral Tablet         Past Medical History:    Past Medical History:   Diagnosis Date   . Attention deficit disorder 08/26/2011   . Chest pain     Has had negative nonnuclear stress tests in October of 2008 and March 2001. Last Holter February 13 demonstrated 13 PVC's with one couplets and normal low and high rates.    . Constipation     Has been seen multiple times by GI for this.    . Fallot tetralogy    . Fever of unknown origin 08/30/2007   . GERD (gastroesophageal reflux disease)    . Headache    . Heart disease    . Impaired hearing     Past history of secretory otitis.    . Otitis media    . Piercing    . Psychiatric problem    . Rash     Eczema in scalp   . Seizure (CMS HCC)     Treated in the past for seizures and followed  by a neurologist.   . STD (sexually transmitted disease) 07/2015    chlamydia   . Thalassemia trait    . Unspecified deficiency anemia     Hemoglobin electrophoresis has shown 87% A, 3.5% A2 and 9% fetal in past.  Patient eats one meal a day. She eats ice.            Past Surgical History:    Past Surgical History:   Procedure Laterality Date   . HX HEART SURGERY     . PB INJECT W CARD CATH LV/LA ANGIO     . PB REPLACEMENT, PULMONARY VALVE     . PB REPR ASD W BYPASS     . PB REPR TET FALLOT W PULM ATRESIA             Family History:  Family Medical History:     Problem Relation (Age of Onset)    ADHD/ADD Other    Congestive Heart Failure Other    Mental Retardation Other    Multiple Sclerosis Other    SLE Other    Seizures Brother            Social History:    Social History     Socioeconomic History   . Marital status: Single     Spouse name: Not on  file   . Number of children: Not on file   . Years of education: 53   . Highest education level: Not on file   Occupational History   . 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, repeating   Tobacco Use   . Smoking status: Current Every Day Smoker     Types: Cigarettes   . Smokeless tobacco: Never Used   . Tobacco comment: Former smoker - stopped when pregnant    Substance and Sexual Activity   . Alcohol use: No     Alcohol/week: 0.0 standard drinks   . Drug use: No   . Sexual activity: Yes     Partners: Male     Birth control/protection: None   Other Topics Concern   . Exercise Concern Not Asked   . Seat Belt Not Asked   . Special Diet Not Asked   . Caffeine Concern Not Asked   . Abuse/Domestic Violence Not Asked   . Right hand dominant Yes   . Left hand dominant Not Asked   . Ambidextrous Not Asked   . Breast Self Exam Not Asked   . Calcium intake adequate Not Asked   . Computer Use Not Asked   . Drives Not Asked   . Helmet Use Not Asked   . Sunscreen used Not Asked   . Uses Cane Not Asked   . Uses walker Not Asked   . Uses wheelchair Not Asked   . Shift Work Not Asked   . Unusual Sleep-Wake Schedule Not Asked   Social History Narrative   . Not on file     Social Determinants of Health     Financial Resource Strain: Not on file   Food Insecurity: Not on file   Transportation Needs: Not on file   Physical Activity: Not on file   Stress: Not on file   Intimate Partner Violence: Not on file   Housing Stability: Not on file         ROS:  General: Denies fever, chills, or any changes in weight.  EENT: Denies blurry vision, tinnitus, nasal congestion, or trouble with swallowing.  Cardiovascular: see HPI  Respiratory: Denies SOB, cough, or asthma/COPD/emphysema.  Gastrointestinal: see HPI  GU: Denies dysuria, change in urinary habits, or hematuria.  Musculoskeletal: Denies weakness or joint pain. Denies trouble moving extremities.  Neurological: hx of seizure  Hematologic: Denies hx of bleeding disorders or blood  clots.  Endocrine: Denies heat/cold intolerance, flushing, or change in glove/shoe size.  Psychiatric: Denies changes in mood, anxiety, or depression.    Objective:  BP 112/72   Pulse 79   Temp 36.3 C (97.3 F)   Ht 1.575 m (5\' 2" )   Wt 60.6 kg (133 lb 9.6 oz)   SpO2 97%   BMI 24.44 kg/m     Physical Exam:  Constitutional: Pleasant, AAOx3, NAD  HEENT: Normocephalic, atraumatic. No scleral icterus.  Neck: Trachea midline, supple.   Cardiovascular: RRR; No audible murmurs.  Pulmonary: Normal respiratory effort. No retractions.  No wheezes or stridor.  Abdomen: Abdomen soft and supple; No tenderness. Non-distended; No masses or HSM present.  Extremities: WWP.  Musculoskeletal: Normal ROM in all four extremities.   Skin: No rashes. Warm and dry.  Psychiatric: Normal mood and affect; Judgement and thought content normal.  Anorectal: No external hemorrhoids, fissures, or lesions, DRE reveals normal sphincter tone with squeeze and push. No blood on gloved finger. No masses or nodularity. Anoscopy reveals small nonbleeding internal hemorrhoids. There was no prolapse on exam today but she did show a picture that did not reveal full mucousal prolapse. The images were more consistent with  grade III internal hemorrhoids and possible enlargement of external hemorrhoids      Procedure Note  Type of Procedure: Anoscopy  Procedure Date: 02/20/2021    Performing Provider: Vania ReaKevin M. Garhett Bernhard, MD  Clinical Indications: hemorrhoids    Description of Procedure: After verbal consent was obtained, the patient was placed in the prone position on the proctology table.  After performing an external visual examination of the perianal area and performing a digital rectal examination, a well-lubricated disposable lighted anoscope was gently inserted through the anal sphincter complex and advanced to the distal rectum easily. A 360 degree visual examination of the anal canal mucosa was performed. The mucosa was examined and there was  nonbleeding small internal hemorrhoids.      The patient tolerated the procedure well.  I performed the procedure.    Specimens sent: none  Estimated Blood Loss: none    Assessment:  [name] is a 20 y.o. female that presents with chronic constipation and likely grade III internal hemorrhoids and possible enlargement of external hemorrhoids    Plan:  1. We explained the difference between prolapsing internal hemorrhoids vs rectal prolapse. We explained that constipation, prolonged sitting, and straining worsen these conditions and they are likely to recur if we do not correct the underlying issue.   2. Ordered gastric emptying study, SBFT, MRI defecography, and sitz marker study  3. Refer to GI for chronic constipation  4. Pt advised to drink at least 64 ounces (8 glasses) of water a day.  5. Pt advised to begin a fiber supplement, such as metamucil  6. Pt advised to avoid straining and limit time sitting on the toilet to 3-5 mins   4. RTC around October with completion of the above studies     He/She was given the opportunity to ask questions, and those questions were appropriately answered. He/She agreed with the treatment plan and was encouraged to call with any additional questions or concerns.    I independently of the faculty provider  spent a total of (15) minutes in direct/indirect care of this patient including initial evaluation, review of laboratory, radiology, diagnostic studies, review of medical record, order entry and coordination of care.    Charlette Caffey, APRN,NP-C, RNFA  The patient was seen with Dr. Josefina Do       Addendum:    I saw and examined this patient with the resident/APP above.  Please see the resident/APP's note, which I have carefully reviewed, for full details. I agree with the findings as documented and plan of care as documented in the resident/APP's note.  I performed the medical decision making in its entirety. Any additions/exceptions are edited/noted.         Jake Samples, MD 02/20/2021 09:42  Colorectal Surgery

## 2021-02-20 ENCOUNTER — Encounter (INDEPENDENT_AMBULATORY_CARE_PROVIDER_SITE_OTHER): Payer: Self-pay | Admitting: GENERAL SURGERY

## 2021-02-20 ENCOUNTER — Other Ambulatory Visit: Payer: Self-pay

## 2021-02-20 ENCOUNTER — Ambulatory Visit: Payer: Medicaid Other | Attending: GENERAL SURGERY | Admitting: GENERAL SURGERY

## 2021-02-20 VITALS — BP 112/72 | HR 79 | Temp 97.3°F | Ht 62.0 in | Wt 133.6 lb

## 2021-02-20 DIAGNOSIS — F17210 Nicotine dependence, cigarettes, uncomplicated: Secondary | ICD-10-CM | POA: Insufficient documentation

## 2021-02-20 DIAGNOSIS — Q213 Tetralogy of Fallot: Secondary | ICD-10-CM | POA: Insufficient documentation

## 2021-02-20 DIAGNOSIS — K649 Unspecified hemorrhoids: Secondary | ICD-10-CM

## 2021-02-20 DIAGNOSIS — D561 Beta thalassemia: Secondary | ICD-10-CM | POA: Insufficient documentation

## 2021-02-20 DIAGNOSIS — R2990 Unspecified symptoms and signs involving the nervous system: Secondary | ICD-10-CM | POA: Insufficient documentation

## 2021-02-20 DIAGNOSIS — Z952 Presence of prosthetic heart valve: Secondary | ICD-10-CM | POA: Insufficient documentation

## 2021-02-20 DIAGNOSIS — Z6824 Body mass index (BMI) 24.0-24.9, adult: Secondary | ICD-10-CM

## 2021-02-20 DIAGNOSIS — K5900 Constipation, unspecified: Secondary | ICD-10-CM | POA: Insufficient documentation

## 2021-02-20 DIAGNOSIS — K623 Rectal prolapse: Secondary | ICD-10-CM | POA: Insufficient documentation

## 2021-03-25 ENCOUNTER — Ambulatory Visit (INDEPENDENT_AMBULATORY_CARE_PROVIDER_SITE_OTHER): Payer: Self-pay | Admitting: GENERAL SURGERY

## 2021-03-25 NOTE — Telephone Encounter (Addendum)
Called the transportation service and faxed medical necessity form. Verified transportation service was schedule to pick up the patient. Called the patient and left a voicemail explaining I called the transportation service and she is verified. Office phone number left if she had any questions.     Rona Ravens, RN  03/25/2021, 16:00      ----- Message from Sallee Lange sent at 03/25/2021  3:19 PM EDT -----  Train Pt:  Britt Bottom with [employer] is now calling about this wanting to know if it is medically needed for the pt to have transportation to this testing. They are needing a reason why it would not be okay for her to travel the 2 1/2 hours before the appt. Please call to update them. Ref number [id number].   Thanks     Called first with no answer.  ----- Message from Gibson Ramp sent at 03/25/2021  2:52 PM EDT -----  Train pt   Pt called again to see if you sent the medical necessity form to [employer] today? She needs to leave today for her appt tomorrow, please return her call

## 2021-03-26 ENCOUNTER — Inpatient Hospital Stay
Admission: RE | Admit: 2021-03-26 | Discharge: 2021-03-26 | Disposition: A | Discharge status: Home / Self Care | Payer: Medicaid Other | Source: Ambulatory Visit | Attending: GENERAL SURGERY | Admitting: GENERAL SURGERY

## 2021-03-26 ENCOUNTER — Other Ambulatory Visit: Payer: Self-pay

## 2021-03-26 DIAGNOSIS — K5900 Constipation, unspecified: Secondary | ICD-10-CM | POA: Insufficient documentation

## 2021-03-26 DIAGNOSIS — K623 Rectal prolapse: Secondary | ICD-10-CM | POA: Insufficient documentation

## 2021-03-26 MED ORDER — BARIUM SULFATE 24 % ORAL SUSPENSION
240.0000 mL | ORAL | Status: AC
Start: 2021-03-26 — End: 2021-03-26
  Administered 2021-03-26: 240 mL via ORAL
  Filled 2021-03-26: qty 600

## 2021-03-31 ENCOUNTER — Ambulatory Visit (HOSPITAL_COMMUNITY): Payer: Self-pay

## 2021-04-14 ENCOUNTER — Ambulatory Visit (INDEPENDENT_AMBULATORY_CARE_PROVIDER_SITE_OTHER): Payer: Self-pay | Admitting: Family

## 2021-04-24 ENCOUNTER — Encounter (INDEPENDENT_AMBULATORY_CARE_PROVIDER_SITE_OTHER): Payer: Self-pay | Admitting: GENERAL SURGERY

## 2021-05-25 IMAGING — US US ABDOMEN LIMITED
1 series · 15 of 25 positions shown · non-contrast
Comparison: None.

CLINICAL DATA: Vomiting and abdominal pain

EXAM:
ULTRASOUND ABDOMEN LIMITED RIGHT UPPER QUADRANT

[Series 1: us abdomen limited · 15 of 34 slices shown]
[im 1/34]
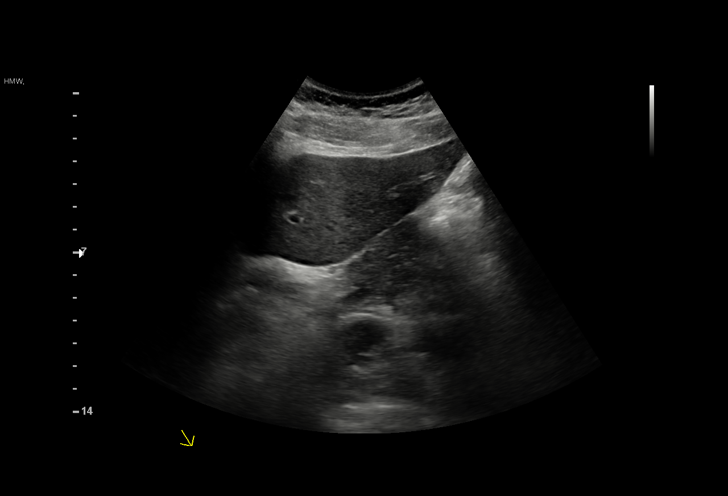
[im 3/34]
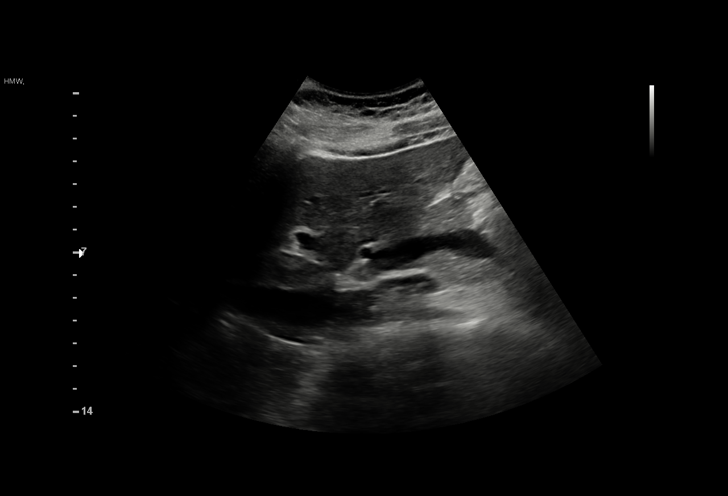
[im 6/34]
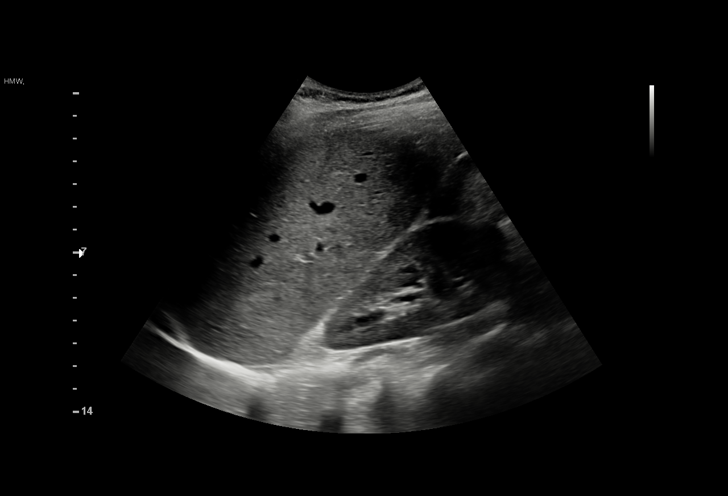
[im 7/34]
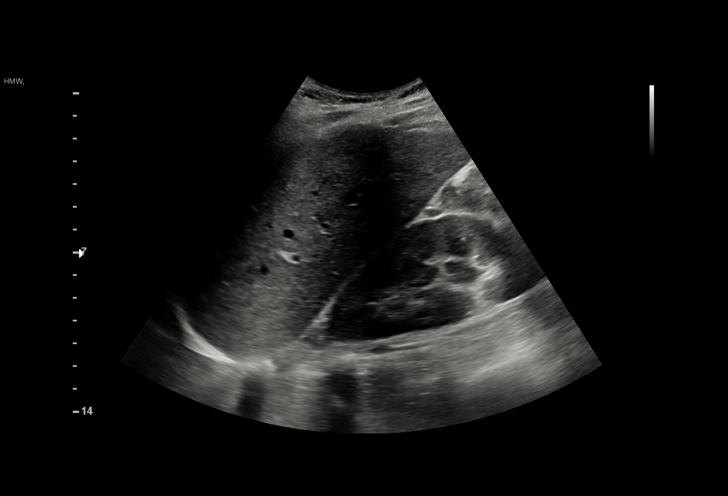
[im 10/34]
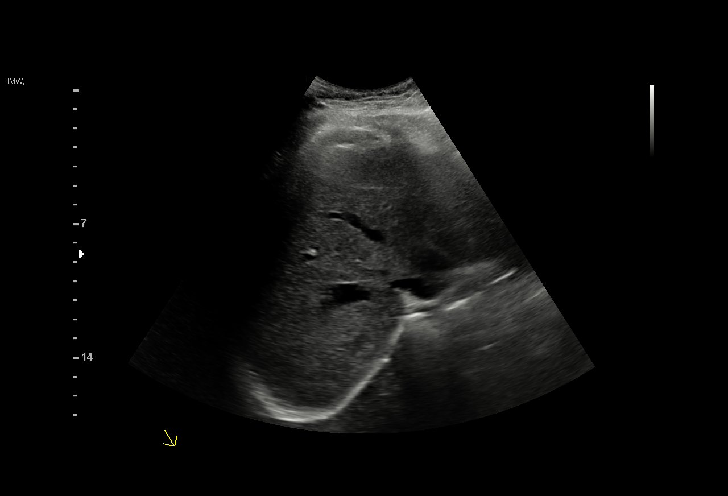
[im 13/34]
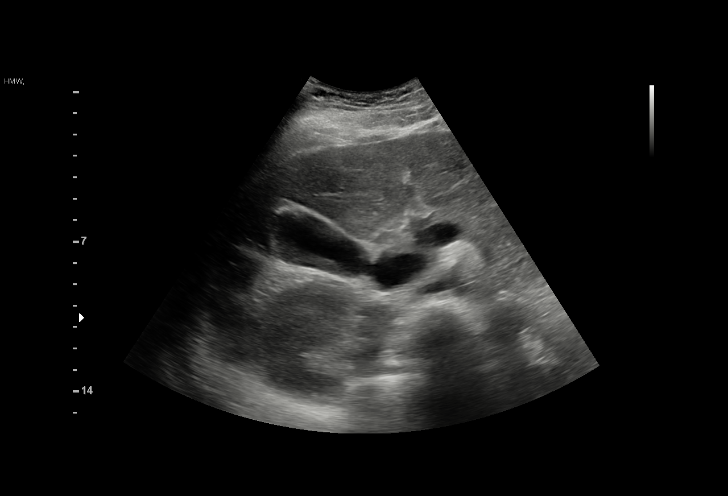
[im 14/34]
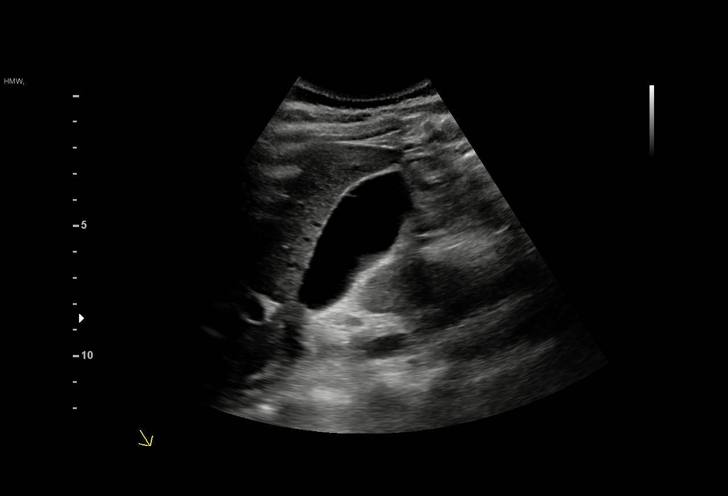
[im 17/34]
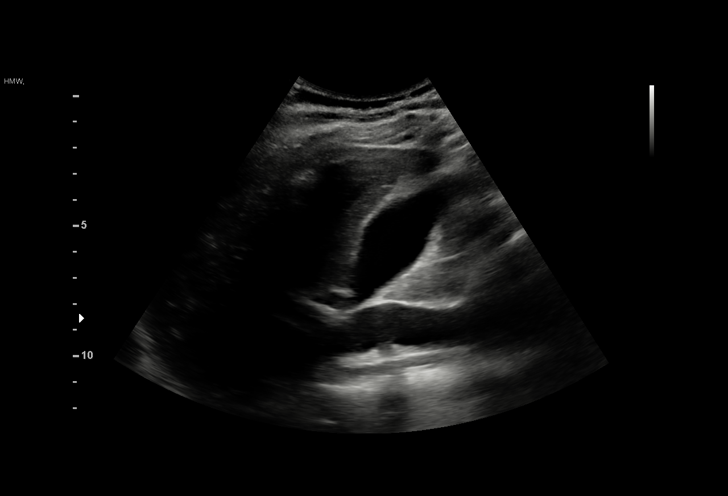
[im 20/34]
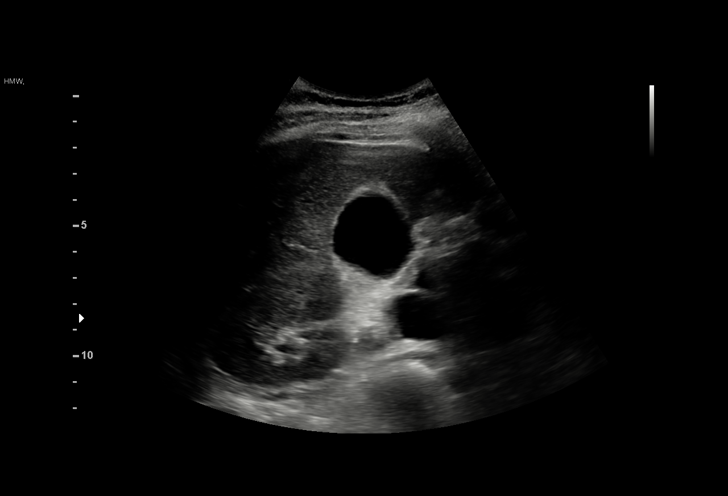
[im 21/34]
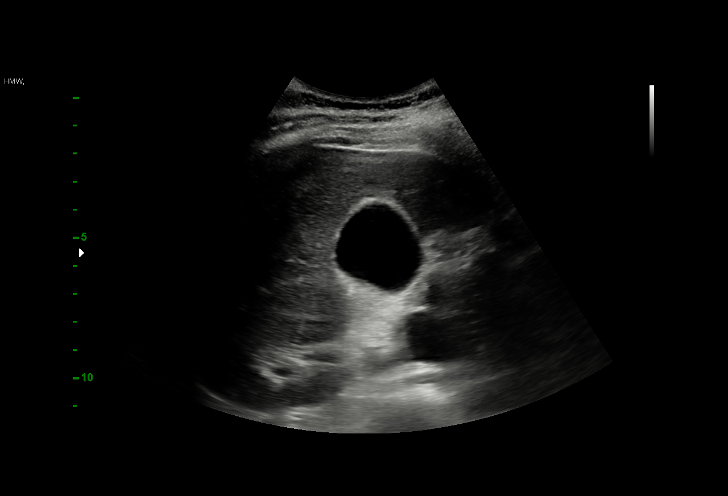
[im 24/34]
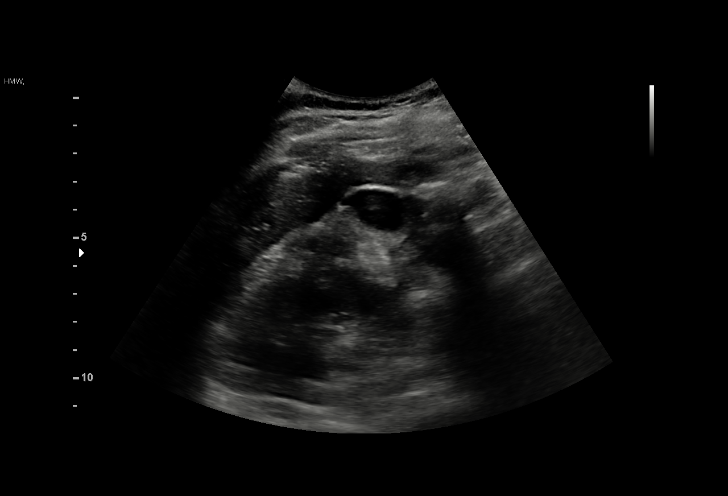
[im 27/34]
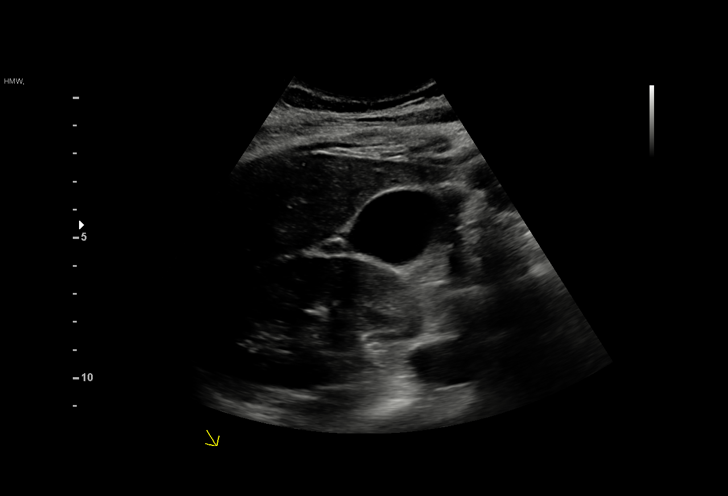
[im 28/34]
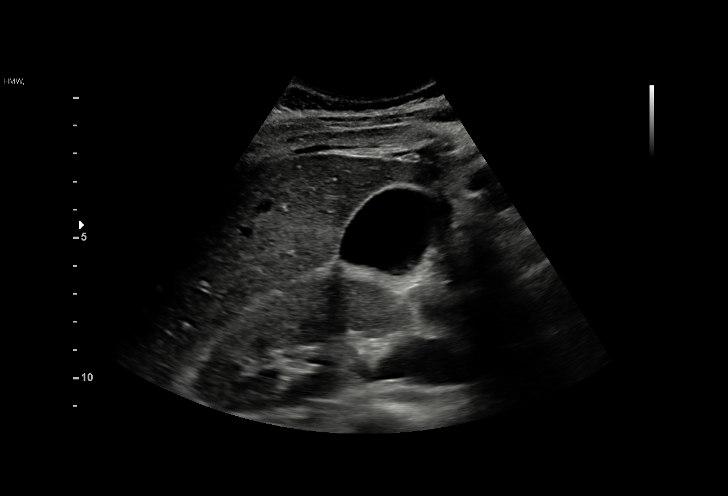
[im 31/34]
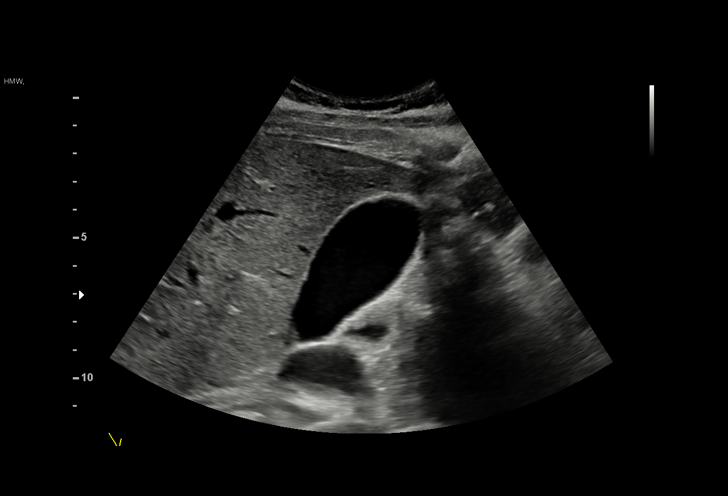
[im 34/34]
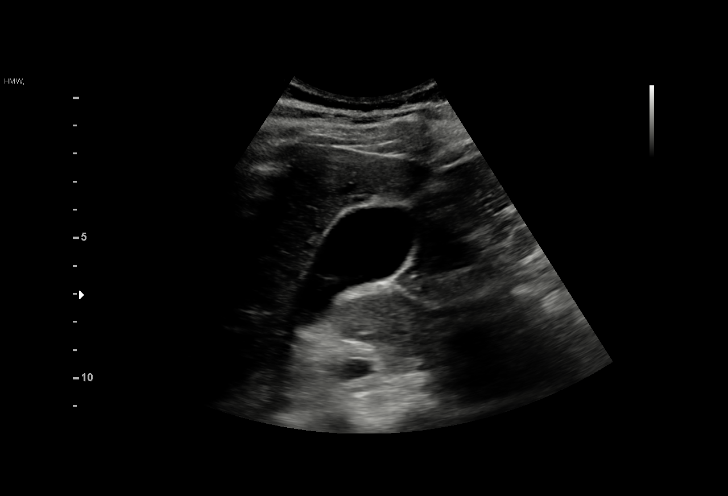

[15 of 25 positions shown; findings below may reference images not displayed]

FINDINGS: Gallbladder:

No gallstones or wall thickening visualized. No sonographic Murphy
sign noted by sonographer.

Common bile duct:

Diameter: 4 mm

Liver:

No focal lesion identified. Within normal limits in parenchymal
echogenicity. Portal vein is patent on color Doppler imaging with
normal direction of blood flow towards the liver.

Other: None.
IMPRESSION: Normal appearing gallbladder and liver

## 2021-06-03 ENCOUNTER — Other Ambulatory Visit (INDEPENDENT_AMBULATORY_CARE_PROVIDER_SITE_OTHER): Payer: Self-pay | Admitting: Internal Medicine

## 2021-06-03 DIAGNOSIS — Z952 Presence of prosthetic heart valve: Secondary | ICD-10-CM

## 2021-06-03 DIAGNOSIS — D561 Beta thalassemia: Secondary | ICD-10-CM

## 2021-06-03 DIAGNOSIS — Q213 Tetralogy of Fallot: Secondary | ICD-10-CM

## 2021-06-04 ENCOUNTER — Encounter (INDEPENDENT_AMBULATORY_CARE_PROVIDER_SITE_OTHER): Payer: Medicaid Other | Admitting: Internal Medicine

## 2021-06-04 ENCOUNTER — Ambulatory Visit (INDEPENDENT_AMBULATORY_CARE_PROVIDER_SITE_OTHER): Payer: Self-pay

## 2021-08-26 ENCOUNTER — Ambulatory Visit (INDEPENDENT_AMBULATORY_CARE_PROVIDER_SITE_OTHER): Payer: Self-pay

## 2021-08-26 ENCOUNTER — Ambulatory Visit
Admission: RE | Admit: 2021-08-26 | Discharge: 2021-08-26 | Disposition: A | Discharge status: Home / Self Care | Payer: Medicaid Other | Source: Ambulatory Visit | Attending: Internal Medicine | Admitting: Internal Medicine

## 2021-08-26 ENCOUNTER — Other Ambulatory Visit (INDEPENDENT_AMBULATORY_CARE_PROVIDER_SITE_OTHER): Payer: Self-pay

## 2021-08-26 ENCOUNTER — Encounter (INDEPENDENT_AMBULATORY_CARE_PROVIDER_SITE_OTHER): Payer: Self-pay

## 2021-08-26 DIAGNOSIS — R002 Palpitations: Secondary | ICD-10-CM

## 2021-08-26 DIAGNOSIS — Q213 Tetralogy of Fallot: Secondary | ICD-10-CM

## 2021-08-26 NOTE — Telephone Encounter (Addendum)
Returned call to Jurline's mother, Janie Morning, who reports that Sophiya has experienced SOB, heavy chest, palpitations a few times in the last 3 weeks, and increasing in frequency.  She reports sudden onset, typically occurs in the evening hours when getting ready for bed, however, symptoms have continued this morning.  Whitlee is currently in [place] with her mother visiting.  I advised to seek local medical attention (family will take to [facility]) with consult with our [facility] On Call Pediatric Cardiologist.  I provided our [facility] line phone # for physician contact.    In the meantime, we will arrange for 14 day Holter monitor to be home enrolled and will set up follow-up appointment with Dr. Claudine Mouton in 1 month.  Will also reschedule CTA from last summer.    [name] verbalized an understanding.    Penne Lash, RN  08/26/2021, 09:56      Regarding: pt experiencing triage symptoms/Mom requesting appt ASAP...  ----- Message from Clint Guy sent at 08/26/2021  9:41 AM EST -----  Mom is also requesting an appointment ASAP.    ----- Message from Clint Guy sent at 08/26/2021  9:36 AM EST -----  [facility] PATIENT    Mom states this patient is experiencing heavy chest, shortness of breath while laying, and swelling in hands and feet. Please call Mom to discuss at [phone].

## 2021-08-27 ENCOUNTER — Telehealth (INDEPENDENT_AMBULATORY_CARE_PROVIDER_SITE_OTHER): Payer: Self-pay | Admitting: Internal Medicine

## 2021-08-27 NOTE — Telephone Encounter (Signed)
Called and confirmed 2/28 ct with [name]    [name]  08/27/2021, 08:42

## 2021-09-09 ENCOUNTER — Ambulatory Visit (HOSPITAL_COMMUNITY): Payer: Self-pay

## 2021-09-17 ENCOUNTER — Ambulatory Visit (INDEPENDENT_AMBULATORY_CARE_PROVIDER_SITE_OTHER): Payer: Self-pay

## 2021-09-17 NOTE — Telephone Encounter (Addendum)
Received 12 lead EKG from outside facility, scanned into [facility].  I reviewed with Pleas Patricia, APRN, and EKG is per [name]'s baseline, RBBB (stable and expected finding).  I explained this to [name] today, and reminded of upcoming testing and appointment next week, 3/14 (CTA and clinic) at our new [place] verbalized an understanding.    Fanny Dance, RN  09/17/2021, 10:10      Regarding: possible left BBB on ekg last week  ----- Message from Marge Duncans sent at 09/15/2021 11:15 AM EST -----  Who: mom [name]  What: due to come for ct and dr gendi appt on 3/14. She went to [facility] last week and had an EKG and chest xray done. The EKG showed possible left bundle branch block and mom is worried. I'm going to reach out and see if we can get a copy of the EKG and chest xray report  Number: [phone]

## 2021-09-19 ENCOUNTER — Ambulatory Visit (INDEPENDENT_AMBULATORY_CARE_PROVIDER_SITE_OTHER): Payer: Self-pay

## 2021-09-19 NOTE — Telephone Encounter (Addendum)
Letter faxed to below number advocating for lodging on the evening before appointments due to convenience of family due to long travel time. Discussed with mother I could not write it was medically necessary.  Germaine Pomfret, RN  09/19/2021, 16:56     ----- Message from Hardin Negus sent at 09/19/2021 10:46 AM EST -----  Dr Claudine Mouton pt-mom called to get a letter stating that they will need to leave early in order to get lodging for patient's upcoming appointment .  Please fax letter to [phone]. Letter needs to state medically necessary.

## 2021-09-23 ENCOUNTER — Ambulatory Visit (INDEPENDENT_AMBULATORY_CARE_PROVIDER_SITE_OTHER)
Admission: RE | Admit: 2021-09-23 | Discharge: 2021-09-23 | Disposition: A | Discharge status: Home / Self Care | Payer: Medicaid Other | Source: Ambulatory Visit

## 2021-09-23 ENCOUNTER — Ambulatory Visit (HOSPITAL_BASED_OUTPATIENT_CLINIC_OR_DEPARTMENT_OTHER)
Admission: RE | Admit: 2021-09-23 | Discharge: 2021-09-23 | Disposition: A | Discharge status: Home / Self Care | Payer: Medicaid Other | Source: Ambulatory Visit

## 2021-09-23 ENCOUNTER — Other Ambulatory Visit (INDEPENDENT_AMBULATORY_CARE_PROVIDER_SITE_OTHER): Payer: Self-pay | Admitting: Internal Medicine

## 2021-09-23 ENCOUNTER — Other Ambulatory Visit: Payer: Self-pay

## 2021-09-23 ENCOUNTER — Ambulatory Visit: Payer: Medicaid Other | Attending: Internal Medicine | Admitting: Internal Medicine

## 2021-09-23 ENCOUNTER — Encounter (INDEPENDENT_AMBULATORY_CARE_PROVIDER_SITE_OTHER): Payer: Self-pay | Admitting: Internal Medicine

## 2021-09-23 VITALS — BP 127/79 | HR 80 | Temp 97.7°F | Ht 60.0 in | Wt 123.9 lb

## 2021-09-23 DIAGNOSIS — D561 Beta thalassemia: Secondary | ICD-10-CM

## 2021-09-23 DIAGNOSIS — Q213 Tetralogy of Fallot: Secondary | ICD-10-CM

## 2021-09-23 DIAGNOSIS — I371 Nonrheumatic pulmonary valve insufficiency: Secondary | ICD-10-CM

## 2021-09-23 DIAGNOSIS — H9190 Unspecified hearing loss, unspecified ear: Secondary | ICD-10-CM

## 2021-09-23 DIAGNOSIS — Z952 Presence of prosthetic heart valve: Secondary | ICD-10-CM

## 2021-09-23 DIAGNOSIS — R002 Palpitations: Secondary | ICD-10-CM

## 2021-09-23 DIAGNOSIS — Q210 Ventricular septal defect: Secondary | ICD-10-CM | POA: Insufficient documentation

## 2021-09-23 DIAGNOSIS — Z955 Presence of coronary angioplasty implant and graft: Secondary | ICD-10-CM | POA: Insufficient documentation

## 2021-09-23 DIAGNOSIS — Z6824 Body mass index (BMI) 24.0-24.9, adult: Secondary | ICD-10-CM

## 2021-09-23 DIAGNOSIS — R569 Unspecified convulsions: Secondary | ICD-10-CM

## 2021-09-23 DIAGNOSIS — Q2111 Secundum atrial septal defect: Secondary | ICD-10-CM | POA: Insufficient documentation

## 2021-09-23 DIAGNOSIS — Z8774 Personal history of (corrected) congenital malformations of heart and circulatory system: Secondary | ICD-10-CM

## 2021-09-23 DIAGNOSIS — K623 Rectal prolapse: Secondary | ICD-10-CM | POA: Insufficient documentation

## 2021-09-23 DIAGNOSIS — R2990 Unspecified symptoms and signs involving the nervous system: Secondary | ICD-10-CM

## 2021-09-23 LAB — CBC WITH DIFF
BASOPHIL #: <0.1 10*3/uL (ref <=0.20)
BASOPHIL %: 1 %
EOSINOPHIL #: 0.15 10*3/uL (ref <=0.50)
EOSINOPHIL %: 2 %
HCT: 40 % (ref 34.8–46.0)
HGB: 12.9 g/dL (ref 11.5–16.0)
IMMATURE GRANULOCYTE #: <0.1 10*3/uL (ref <0.10)
IMMATURE GRANULOCYTE %: 0 % (ref 0–1)
LYMPHOCYTE #: 3.18 10*3/uL (ref 1.00–4.80)
LYMPHOCYTE %: 47 %
MCH: 26.1 pg (ref 26.0–32.0)
MCHC: 32.3 g/dL (ref 31.0–35.5)
MCV: 81 fL (ref 78.0–100.0)
MONOCYTE #: 0.56 10*3/uL (ref 0.20–1.10)
MONOCYTE %: 8 %
MPV: 8.5 fL — ABNORMAL LOW (ref 8.7–12.5)
NEUTROPHIL #: 2.91 10*3/uL (ref 1.50–7.70)
NEUTROPHIL %: 42 %
PLATELETS: 391 10*3/uL (ref 150–400)
RBC: 4.94 10*6/uL (ref 3.85–5.22)
RDW-CV: 16 % — ABNORMAL HIGH (ref 11.5–15.5)
WBC: 6.9 10*3/uL (ref 3.7–11.0)

## 2021-09-23 LAB — COMPREHENSIVE METABOLIC PANEL, NON-FASTING
ALBUMIN: 4.1 g/dL (ref 3.5–5.0)
ALKALINE PHOSPHATASE: 107 U/L (ref 40–110)
ALT (SGPT): 10 U/L (ref 8–22)
ANION GAP: 6 mmol/L (ref 4–13)
AST (SGOT): 12 U/L (ref 8–45)
BILIRUBIN TOTAL: 0.3 mg/dL (ref 0.3–1.3)
BUN/CREA RATIO: 10 (ref 6–22)
BUN: 8 mg/dL (ref 8–25)
CALCIUM: 9.3 mg/dL (ref 8.5–10.0)
CHLORIDE: 110 mmol/L (ref 96–111)
CO2 TOTAL: 21 mmol/L — ABNORMAL LOW (ref 22–30)
CREATININE: 0.81 mg/dL (ref 0.60–1.05)
ESTIMATED GFR: >90 mL/min/BSA (ref >=60)
GLUCOSE: 83 mg/dL (ref 65–125)
POTASSIUM: 3.6 mmol/L (ref 3.5–5.1)
PROTEIN TOTAL: 7.3 g/dL (ref 6.4–8.3)
SODIUM: 137 mmol/L (ref 136–145)

## 2021-09-23 LAB — LAVENDER TOP TUBE

## 2021-09-23 LAB — C-REACTIVE PROTEIN(CRP),HIGH SENSITIVITY,CARDIAC: CRP CARDIAC MARKER: 0.5 mg/L (ref <1.0)

## 2021-09-23 LAB — LIGHT GREEN TOP TUBE

## 2021-09-23 MED ORDER — IOPAMIDOL 370 MG IODINE/ML (76 %) INTRAVENOUS SOLUTION
80.0000 mL | INTRAVENOUS | Status: AC
Start: 2021-09-23 — End: 2021-09-23
  Administered 2021-09-23: 80 mL via INTRAVENOUS

## 2021-09-23 NOTE — Nurses Notes (Signed)
Labs obtained by 22 g butterfly peripheral stick. Tolerated without difficulty. Site WNL, Band-Aid applied. Aleyda Gindlesperger RN

## 2021-09-23 NOTE — Progress Notes (Addendum)
general     [facility]   ACHD Clinic Note            Date:              09/23/2021  Name:            [name]  Age:               21 y.o.  DOB:            [birth year]     Chief Complaint:   Chief Complaint   Patient presents with   . Follow Up     Patient went to the ER a few weeks ago because her halter monitor was flashing. The doctor at the ER stated that she had a left bundle branch block.          History of Present Illness:     I was asked to see this patient by Primary Care Provider Everlena Cooper, DO to consult regarding cardiac issues.  Patient is a 21 y.o.  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    [name] has been seen by Dr. Hardin Negus & dr. Jenean Lindau. 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    She was born at 64 weeks 6 days GA. When she was born there were no signs of something wrong. However, at 62 old she had an episode of Cyanosis. Diagnosis was obtained.  Eckert who placed a heart stent at nine months of age.  At 21 years of age they placed a 90 gauge tissue valve.   At the age of 64 she had open heart surgery where they replaced her pulmonary valve with a pig valve.  Her mother reports that with each valve surgery they expected the valve to last ~10 years. They have consistently lasted for 7 years before needing to be replaced.    [name] was also told that she would be unable to have children, however, she now has two children. The only issue with delivery was very low blood pressure. Both pregnancies were Vaginal and Induced.  For the last portion of her pregnancy with her second child she required constant monitoring. Her children are both from different fathers and they are semi-involved with the children.  This year 2022 She went to the hospital last weekend due to issue with the whole left side of her body feeling heavy and limp. She called an ambulance immediately after calling her mother, the  EMT's noted signs of a stroke. She was taken to [facility] where they reported that she did not have a stroke but said that she should be seen by a cardiologist as soon as possible.   On Sunday, [name] started to have nose bleeds before going to bed. Upon laying down, she began to lose sensation in the left side of her body and developed a headache. She went to the ED very soon after. While at the ED the doctors performed a CT scan, they did not provide [name] with any information about results. They only suggested that she be seen in [place]. She did not have any similar issues prior to this episode. It took her around 3 hours to return to her normal self. for that she was seen and cleared by neurology who referred that to the migraine as per the patient  ( reports not available to me )   [name] had her first migraine 6  years ago. They are always focused on the left side of her head and the left side of her neck. She generally requires the lights to be off and minimal noise to get over these headaches. [name]  experiences shortness of breath while walking very short distances.  Her brother and uncle both have asthma.  She has no activity restriction, ADL,  No history of asthma. gained/ lost weight   Has 7 siblings who are healthy.  Has good appetite and sleeps well.   Patient and mother deny:  Diaphoresis, Respiratory Distress, Exercise Intolerance, Chest Pain, Palpitations, Syncope, Edema.  The c/o rectal prolapse that is getting worse      Past Medical /Surgical History:      Past Medical History:   Diagnosis Date   . Attention deficit disorder 08/26/2011   . Chest pain     Has had negative nonnuclear stress tests in October of 2008 and March 2001. Last Holter February 13 demonstrated 13 PVC's with one couplets and normal low and high rates.    . Constipation     Has been seen multiple times by GI for this.    . Fallot tetralogy    . Fever of unknown origin 08/30/2007   . GERD (gastroesophageal reflux disease)     . Headache    . Heart disease    . Impaired hearing     Past history of secretory otitis.    . Otitis media    . Piercing    . Psychiatric problem    . Rash     Eczema in scalp   . Seizure (CMS [facility])     Treated in the past for seizures and followed by a neurologist.   . STD (sexually transmitted disease) 07/2015    chlamydia   . Thalassemia trait    . Unspecified deficiency anemia     Hemoglobin electrophoresis has shown 87% A, 3.5% A2 and 9% fetal in past.  Patient eats one meal a day. She eats ice.          Past Surgical History:   Procedure Laterality Date   . HX HEART SURGERY     . PB INJECT W CARD CATH LV/LA ANGIO     . PB REPLACEMENT, PULMONARY VALVE     . PB REPR ASD W BYPASS     . PB REPR TET FALLOT W PULM ATRESIA           Family History:       Family Medical History:     Problem Relation (Age of Onset)    ADHD/ADD Other    Congestive Heart Failure Other    Mental Retardation Other    Multiple Sclerosis Other    SLE Other    Seizures Brother        No family history of Congenital Heart Defects.  No family history of sudden cardiac deaths.  +Family history of Migraines in her mother.  +Family history of Asthma    Social History:     Social History   . Foster child Yes    . Smoke exposure No    . Patients' primary caregiver? Dr. Cassell Smiles    . Lives with Biologic Parent No         Medications       Current Outpatient Medications:   .  aspirin (ECOTRIN) 81 mg Oral Tablet, Delayed Release (E.C.), Take 81 mg by mouth Once a day (Patient not taking: No sig reported), Disp: , Rfl:   .  docusate sodium (COLACE) 100 mg Oral Capsule, Take 1 Cap (100 mg total) by mouth Twice daily (Patient not taking: No sig reported), Disp: 60 Cap, Rfl: 2  .  hydrocortisone 2.5 % Apply externally cream with perineal applicator, by Rectal route Twice daily, Disp: 28.35 g, Rfl: 0  .  hydrocortisone acetate (ANUSOL-HC) 25 mg Rectal Suppository, 1 Suppository (25 mg total) by Rectal route Twice per day as needed For internal hemorrhoids  (Patient not taking: No sig reported), Disp: 12 Suppository, Rfl: 0  .  hydrOXYzine HCl (ATARAX) 25 mg Oral Tablet, Take 1 Tab (25 mg total) by mouth Three times a day as needed for Itching (Patient not taking: No sig reported), Disp: 60 Tab, Rfl: 1  .  Ibuprofen (MOTRIN) 600 mg Oral Tablet, Take 1 Tab (600 mg total) by mouth Four times a day as needed for Pain, Disp: 60 Tab, Rfl: 2  .  prenatal vitamin-iron-folate Tablet, Take 1 Tab by mouth Once a day (Patient not taking: No sig reported), Disp: , Rfl:   .  raNITIdine (ZANTAC) 150 mg Oral Tablet, Take 1 Tab (150 mg total) by mouth Twice daily For acid reflux (Patient not taking: No sig reported), Disp: 60 Tab, Rfl: 2  .  valACYclovir (VALTREX) 1 gram Oral Tablet, , Disp: , Rfl:   No current facility-administered medications for this visit.    Review of Systems:      CONSTITUTIONAL:  Negative   EYES:  Negative  ENT: Negative   CARDIOVASCULAR:  see HPI  RESPIRATORY:  see HPI  NEUROLOGICAL:  +Migraines w/ aura.  GI:  +Rectal Prolapse. Constipation (controlled using Miralax).  GU: Negative for frequency  ENDOCRINE:  Negative  SKIN:  Negative   MUSCULOSKELETAL:  Negative   HEM/LYMPH:  Negative   ALLERGIC/IMM:  Negative  PSYCHIATRIC:  Negative for bad mood     Physical Examination:     Vitals:    09/23/21 1310   BP: 127/79   Pulse: 80   Temp: 36.5 C (97.7 F)   TempSrc: Thermal Scan   SpO2: 98%   Weight: 56.2 kg (123 lb 14.4 oz)   Height: 1.524 m (5')   BMI: 24.25        CONSTITUTIONAL: Non dysmorphic   GENERAL: Patient is in no acute distress, no cyanosis or jaundice.   HEAD: Head is normocephalic, atraumatic. No palpable masses.  EYES: PERRL, no conjunctival injection, no pallor or icterus  ENT: NC/AT, no rhinorrhea, normal appearing nasal septum, normal turbinates, normal ear position, OP clear, MMM, no significant dental carries.   NECK:  Supple, Trachea is midline. No masses are palpated.  LYMPH: No lymphadenopathy palpable in the neck.  RESPIRATORY:  Symmetric  chest expansion, easy work of breathing, clear to auscultation bilaterally, no retraction, no wheezing rhonchi or crackles.    CARDIOVASCULAR:  Normoactive precordium, no heave, no JVD, regular rhythm, Nml S1, Nml and physiologically split S2; No gallop or clicks, no murmur or rub. 2+ and equal radia/femoral/ DP pulses bilaterally. No radiofemoral delay, CR`2secs, no extremities edema or cyanosis  GI: Soft, non tender, non distended, no significant hepatosplenomegaly.   NEUROLOGICAL: No focal deficit, grossly normal strength and tone.   SKIN: Skin is warm and dry to touch. no rashes  MUSCULOSKELETAL: Extremities move equally well.no chest wall tenderness.   PSYCHIATRIC: Patient is pleasant, cooperative and alert.      Labs      I personnally reviewed the labs of the patient, will need new labs.  Data Reviewed:     ECG: 09/23/2021: reviewed and interpreted by myself:  Sinus rhythm, Left axis deviation, RBBB with secondary T wave inversion   ECHO 09/23/2021: supervised and reviewed by myself; my interpretation is as follows:   Pt with TOF with absent pulmonary valve syndrome by hx.  No residual intracardiac shunt.  Good s function of both ventricles.  Only mild RV Dilatation.  There is a bioprosthetic pulmonary valve (#29 porcine) with thickened leaflets that are functioning well. Mild gradient  across the valve, no significant regurgitation. . PS gradient is 20 mm hg peak and 12 mm Hg mean.    MRI:  CTA:   CPT:  Holter:   Event:   Cath:    ACHD AP classification:    CHD anatomy:  I.  Simple: small ASD, VSD, mild PS, previous PDA ligation. repaired ASD & VSD no residue   II Moderate: repaired or unrepaired:   Ao/LV fistula; APVR; Anomalous coronary; AVSD p/c; AoVD v/subv; MVD; coarc; Ebstein; Mod to Large unrepaired ASD/PDA/VSD ;PR/PS mode/sev; Sinus of V fistula/aneur; RTOF; straddling AVV  III Complex: cyanotic unrepaired or palliated; DORV; Fontan; IAA, M atresia; P atresia; TGA D/L; SV; TA; Isomerism    Physiologic stage:  A:  NYHA I; no hemodynamic or anatomic sequale; no arhythmia; Normal ex; Normal R/H/P function   B: NYHA II; Mild hemodyn mild Ao dil, mild vent dysf, mild vent dil; mild val disease; Arrhythmia no ttt; objective limitation to ex.   C: NYHA III; > mod valve disease; . mod vent dysf; Mod AO dil, mild/mod hypoxia; Vein or art stenosis;> mod shunt; mod PH; arrhythmia on ttt; end organ dysf responsive to ttt  D: NYHA IV; sev ao dil; arrhythmia refractory to ttt; sev PH; sev Hypoxia; Eisem; refractory end organ dysf.    Patient is class II stage A-B      Assessment and Plan:      [name] 21 y.o.  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 She underwent complete correction of the defect on July 27, 2000, with Dacron patch closure of a large malalignment ventricular septal defect, infundibular muscle resection and transannular right ventricular outflow tract pericardial patch, as well as primary suture closure of a moderate-sized secundum atrial septal defect.   She later underwent pulmonary valve replacement with a #27 Carpentier-Edwards pericardial bioprosthetic valve and bovine pericardial patch angioplasty of the main pulmonary artery on August 18, 2007.  On 02/01/2014 she underwent excision of the old #27 Carpentier-Edwards bioprosthetic pulmonary valve, pulmonary valve replacement with a #29 Medtronic Mosaic porcine bioprosthetic pulmonary valve, bovine pericardial patch angioplasty of the main pulmonary artery at the pulmonary valve insertion site. Her heart condition is stable today.  She gave birth to 2 children who are healthy.  She did not have problems and did well after its own.  From the card point of view she is still doing well she works at [employer]   She got her CTA today final report pending   Neurology have seen her for the episode of focal weakness and all tests were normal   She needs to see surgery for rectal prolapse that is becoming  worse.  Also see Ob/Gyn for bleeding  As well as hemonc for B thalassemias ( i spoke with Dr Loreta Ave in ped hematology who will be following her)   Will place a Holter monitor 14 days today   will continue to follow and in the future  will obtain an MRI MRA for better assessment of the ventricular function as well as the pulmonary valve.  I asked her to stop the aspirin as long as she has the bleeding. then will discuss if needed.   Will order labs today: CBC, CMP, CRP, Vit D, TSH, T3, UA     PLAN:    Holter  Labs today   Refer to OB/Gyn   Refer her to General surgery  Refer to hematology ( Dr. Loreta Ave ped heme)   F/U in 6 months  Medications: Aspirin   No Activity Restrictions  She needs SBE Prophylaxis according to most recent AHA guidelines.   No contraindications for any interventional procedures or need for anesthesia from the cardiac stand point       I have discussed the plan with the family. All their question has been answered. Discharge Instructions were emphasized to the family by myself and the clinical staff.  Her mother will call if any problem/issues arise.   I spent more than 50 % of this 60 min visit face to face with the family counseling and coordinating care.   I was supervising the echo and showed pictures to the family. I drew diagrams to better explain the pathology and intervention. I explained the plan and the steps to follow.   I discussed life style modification healthy diet, decrease weight, decrease salt and exercise and the benefit of each on the cardiovascular system.       Mitzie Na, MD  ACHD Program Director  Co Director CPET lab   Assistant Professor  Section of Pediatric Cardiology  [facility] Department of Pediatrics

## 2021-09-24 LAB — HGA1C (HEMOGLOBIN A1C WITH EST AVG GLUCOSE)
ESTIMATED AVERAGE GLUCOSE: 108 mg/dL
HEMOGLOBIN A1C: 5.4 % (ref 4.0–5.6)

## 2021-10-02 LAB — VITAMIN D 25 TOTAL: VITAMIN D, 25OH: 25 ng/mL — ABNORMAL LOW (ref 30–100)

## 2021-10-07 ENCOUNTER — Ambulatory Visit (INDEPENDENT_AMBULATORY_CARE_PROVIDER_SITE_OTHER): Payer: Self-pay | Admitting: GENERAL SURGERY

## 2021-10-07 DIAGNOSIS — H9190 Unspecified hearing loss, unspecified ear: Secondary | ICD-10-CM

## 2021-10-07 DIAGNOSIS — I4510 Unspecified right bundle-branch block: Secondary | ICD-10-CM

## 2021-10-07 DIAGNOSIS — R569 Unspecified convulsions: Secondary | ICD-10-CM

## 2021-10-07 DIAGNOSIS — Z952 Presence of prosthetic heart valve: Secondary | ICD-10-CM

## 2021-10-07 DIAGNOSIS — D561 Beta thalassemia: Secondary | ICD-10-CM

## 2021-10-07 DIAGNOSIS — R9431 Abnormal electrocardiogram [ECG] [EKG]: Secondary | ICD-10-CM

## 2021-10-07 DIAGNOSIS — Q213 Tetralogy of Fallot: Secondary | ICD-10-CM

## 2021-10-07 LAB — ECG 12-LEAD - PEDS (FOR USE IN PED CARD CLINIC ONLY)
Atrial Rate: 74 {beats}/min
Calculated P Axis: 31 degrees
Calculated R Axis: -67 degrees
Calculated T Axis: 75 degrees
PR Interval: 184 ms
QRS Duration: 160 ms
QT Interval: 418 ms
QTC Calculation: 463 ms
Ventricular rate: 74 {beats}/min

## 2021-10-14 ENCOUNTER — Ambulatory Visit (INDEPENDENT_AMBULATORY_CARE_PROVIDER_SITE_OTHER): Payer: Self-pay | Admitting: GENERAL SURGERY

## 2021-10-14 ENCOUNTER — Ambulatory Visit (INDEPENDENT_AMBULATORY_CARE_PROVIDER_SITE_OTHER): Payer: Medicaid Other | Admitting: GENERAL SURGERY

## 2021-10-14 NOTE — H&P (Deleted)
Colorectal Surgery Clinic Note    Name: [name]  MRN: [id number]  Date of Birth:  2001/05/15  Date of Service: 10/14/2021  Referring Provider: Jaclyn Prime, MD    Chief Complaint:     History of Present Illness:    [name] is a 21 y.o. female presenting for a new patient visit  for the evaluation of rectal prolapse.    Pt previously evaluated by Dr. Josefina Do in August 2022; "She and her mother report a long standing hx of chronic constipation since childhood. She's tried multiple OTC medications including laxatives, fiber, and stool softeners. She has seen GI but it's been several years. She states that she had a balloon test but no imaging or colonoscopy. She's not currently taking any bowel regimen. She has a BM once every few weeks. These are firm and small. She sits on the toilet for 30 mins and endorse straining. She has tissue protruding from her anus with BMs and heavy lifting. She has to manually reduce this 90% of the time. She does endorse recent bleeding from the area but no hematochezia, melena, or blood dripping into the toilet. She denies any FI of stool or gas. She drinks 64 oz of water daily and is active. She does have 2 children via vaginal birth. Her protrusion has gotten worse since her second child. She denies any abdominal, n/v, fevers, or chills but does endorse pain with BM since childhood."    Today, pt reports X changes from     In regards to bowel habits, the patient describes:   Bowel movement frequency:   Character of stools:   Blood: {YES/NO:19625}  Straining: {YES/NO:19625}  Time on commode:   Bowel regimen/medications:      Most recent colonoscopy: N/A  Past abdominal surgery history:   Past anorectal surgery history: N/A  FH: {DENIES/ENDORSES:33053} family history of colorectal cancer.    Medications of note:  Major medical problems: ADHD, Angina, constipation, fallot tetralogy, GERD, HA, seizure, thalassemia trait      Allergies:  No Known  Allergies    Medications:  No outpatient medications have been marked as taking for the 10/14/21 encounter (Appointment) with Beola Cord, DO.     PMH:  Past Medical History:   Diagnosis Date   . Attention deficit disorder 08/26/2011   . Chest pain     Has had negative nonnuclear stress tests in October of 2008 and March 2001. Last Holter February 13 demonstrated 13 PVC's with one couplets and normal low and high rates.    . Constipation     Has been seen multiple times by GI for this.    . Fallot tetralogy    . Fever of unknown origin 08/30/2007   . GERD (gastroesophageal reflux disease)    . Headache    . Heart disease    . Impaired hearing     Past history of secretory otitis.    . Otitis media    . Piercing    . Psychiatric problem    . Rash     Eczema in scalp   . Seizure (CMS [facility])     Treated in the past for seizures and followed by a neurologist.   . STD (sexually transmitted disease) 07/2015    chlamydia   . Thalassemia trait    . Unspecified deficiency anemia     Hemoglobin electrophoresis has shown 87% A, 3.5% A2 and 9% fetal in past.  Patient eats one meal a day. She  eats ice.      PSH:  Past Surgical History:   Procedure Laterality Date   . HX HEART SURGERY     . PB INJECT W CARD CATH LV/LA ANGIO     . PB REPLACEMENT, PULMONARY VALVE     . PB REPR ASD W BYPASS     . PB REPR TET FALLOT W PULM ATRESIA       FAMHX:  Family Medical History:     Problem Relation (Age of Onset)    ADHD/ADD Other    Congestive Heart Failure Other    Mental Retardation Other    Multiple Sclerosis Other    SLE Other    Seizures Brother        SOCHX:  Social History     Socioeconomic History   . Marital status: Single   . Years of education: 9   Occupational History   . 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, repeating   Tobacco Use   . Smoking status: Every Day     Types: Cigarettes   . Smokeless tobacco: Never   . Tobacco comments:     Former smoker - stopped when pregnant    Substance and Sexual Activity   . Alcohol use: No      Alcohol/week: 0.0 standard drinks   . Drug use: No   . Sexual activity: Yes     Partners: Male     Birth control/protection: None   Other Topics Concern   . Right hand dominant Yes       Review of Systems:     Other than ROS in the HPI, all other systems were negative.      Physical Exam:    There were no vitals taken for this visit.    Physical Exam:  Constitutional: Pleasant, AAOx3, NAD  HEENT: Normocephalic, atraumatic. No scleral icterus.  Neck: Trachea midline, supple.   Cardiovascular: RRR; No audible murmurs.  Pulmonary: Normal respiratory effort. No retractions.  No wheezes or stridor.  Abdomen: Abdomen soft and supple; No tenderness. Non-distended; No masses present.  Musculoskeletal: Normal ROM in all four extremities.   Skin: No rashes. Warm and dry.  Psychiatric: Normal mood and affect; Judgement and thought content normal.  Anorectal:    Laboratory Data:      I have reviewed all pertinent lab values.     Prior Endoscopy:     N/A    Pathology:    N/A    Imaging:    Fluoro small bowel follow thru 03/26/21:  IMPRESSION:  Appropriate transit of contrast throughout the small bowel without evidence of narrowing or obstruction.    Assessment/Plan:    [name] is a 21 y.o. female who presents today for surgical evaluation/management of ***.       Plan:   -     Return to clinic ***        The patient was seen as a shared visit with Dr. Ruthell Rummage faculty. He/She was given the opportunity to ask questions, and those questions were appropriately answered. He/She agreed with the treatment plan and was encouraged to call with any additional questions or concerns.     Bonney Leitz, PA-C   10/14/2021 07:18

## 2021-10-15 DIAGNOSIS — R002 Palpitations: Secondary | ICD-10-CM

## 2021-10-15 DIAGNOSIS — D561 Beta thalassemia: Secondary | ICD-10-CM

## 2021-10-15 DIAGNOSIS — Q213 Tetralogy of Fallot: Secondary | ICD-10-CM

## 2021-10-15 DIAGNOSIS — I371 Nonrheumatic pulmonary valve insufficiency: Secondary | ICD-10-CM

## 2021-10-15 LAB — PEDS 14 DAY (HOME ENROLLMENT) EXTENDED HOLTER MONITOR
Heart rate (average): 97 {beats}/min
Isolated SVE count: 21 episodes
Isolated VE Counts: 372 episodes
Longest trigeminy - duration: 5.1 s
VE Couplets Counts: 11 episodes

## 2021-11-03 DIAGNOSIS — Z952 Presence of prosthetic heart valve: Secondary | ICD-10-CM

## 2021-11-03 DIAGNOSIS — Z9889 Other specified postprocedural states: Secondary | ICD-10-CM

## 2021-11-03 DIAGNOSIS — I288 Other diseases of pulmonary vessels: Secondary | ICD-10-CM

## 2021-11-03 DIAGNOSIS — I517 Cardiomegaly: Secondary | ICD-10-CM

## 2021-11-03 DIAGNOSIS — Q213 Tetralogy of Fallot: Secondary | ICD-10-CM

## 2021-11-03 DIAGNOSIS — I281 Aneurysm of pulmonary artery: Secondary | ICD-10-CM

## 2021-11-05 NOTE — Progress Notes (Deleted)
Pediatric Hematology/Oncology   Outpatient History and Physical Note     [name], [name]  Date of Service: 11/05/2021   Date of Birth:  2001/06/14    PCP: Tonya Furbish, DO  Chief Complaint:  [name]      HPI: [name] is a 21 y.o. female with a history of *** who presents with ***    Patient Active Problem List    Diagnosis Date Noted   . Alpha thalassemia minor trait 02/03/2016   . Pregnancy 12/26/2015   . Vitamin D deficiency 08/02/2014   . Beta thalassemia trait 08/02/2014   . Menorrhagia 08/02/2014   . Beta thalassemia (CMS HCC) 11/08/2013   . Syncopal episodes 11/08/2013   . Spell of transient neurologic symptoms 07/31/2013   . History of posttraumatic stress disorder (PTSD) 07/31/2013   . Unspecified deficiency anemia    . Impaired hearing    . Seizure (CMS HCC)    . Chest pain    . Constipation    . Attention deficit disorder (ADD) 08/26/2011   . Family history of asthma 08/26/2011   . Observed seizure-like activity (CMS HCC) 10/03/2009   . High risk social situation 08/21/2007   . Tetralogy of Fallot 08/18/2007   . 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 GERD 07/27/2000     Past Medical History:   Diagnosis Date   . Attention deficit disorder 08/26/2011   . Chest pain     Has had negative nonnuclear stress tests in October of 2008 and March 2001. Last Holter February 13 demonstrated 13 PVC's with one couplets and normal low and high rates.    . Constipation     Has been seen multiple times by GI for this.    . Fallot tetralogy    . Fever of unknown origin 08/30/2007   . GERD (gastroesophageal reflux disease)    . Headache    . Heart disease    . Impaired hearing     Past history of secretory otitis.    . Otitis media    . Piercing    . Psychiatric problem    . Rash     Eczema in scalp   . Seizure (CMS [facility])     Treated in the past for seizures and followed by a neurologist.   . STD (sexually transmitted  disease) 07/2015    chlamydia   . Thalassemia trait    . Unspecified deficiency anemia     Hemoglobin electrophoresis has shown 87% A, 3.5% A2 and 9% fetal in past.  Patient eats one meal a day. She eats ice.          Past Surgical History:   Procedure Laterality Date   . HX HEART SURGERY     . PB INJECT W CARD CATH LV/LA ANGIO     . PB REPLACEMENT, PULMONARY VALVE     . PB REPR ASD W BYPASS     . PB REPR TET FALLOT W PULM ATRESIA            Cannot display prior to admission medications because the patient has not been admitted in this contact.          Medications:   No outpatient medications have been marked as taking for the 11/06/21 encounter (Appointment) with Tonya Hillier, DO.       No Known Allergies  Social History     Tobacco Use   .  Smoking status: Every Day     Types: Cigarettes   . Smokeless tobacco: Never   . Tobacco comments:     Former smoker - stopped when pregnant    Substance Use Topics   . Alcohol use: No     Alcohol/week: 0.0 standard drinks     Family Medical History:     Problem Relation (Age of Onset)    ADHD/ADD Other    Congestive Heart Failure Other    Mental Retardation Other    Multiple Sclerosis Other    SLE Other    Seizures Brother           ROS: positive if bolded  Constitutional: fever, chills, malaise, diaphoresis, weight loss  Eyes: visual disturbance, blurred vision, irritation  Ears, Nose, Mouth, Throat, Face: ear discharge, nasal congestion, sore throat  Respiratory: cough, sputum, dyspnea on exertion  Cardiovascular: chest pain, dyspnea, palpitations, lower ext. Edema  Gastrointestinal: nausea, vomiting, diarrhea, constipation, abdominal pain  Genitourinary: frequency, dysuria, hematuria  Integument/breast: rash, pruritus, lesions  Hematologic: bleeding, easy bruising, lymphadenopathy  Musculoskeletal: myalgias, arthralgias, back pain, muscle weakness  Neurological: headaches, dizziness, seizures  Behavioral/Psych: depression, anxiety    EXAM:  Vital signs:   There were no  vitals filed for this visit.         General: No acute distress, resting comfortably   HEENT: Head atraumatic and normocephalic, mucous membranes moist, pupils equal and round and reactive to light  Neck: No JVD or thyromegaly or lymphadenopathy   Lungs: No respiratory distress, lungs CTAB, no wheezing, rhonchi, rales  Cardiovascular: normal rate, regular rhythm, S1, S2 normal, no murmur appreciated  Abdomen: Soft, non-tender, non-distended, bowel sounds normal, no hepatosplenomegaly   Extremities: Extremities normal, atraumatic, no cyanosis or edema, distal pulses 2+ and equal bilaterally.   Skin: Skin warm and dry, no rashes or lesions, no bruising, no petechiae    Neurologic: Alert, oriented, no focal deficit, CN II-XII grossly intact  Lymphatics: No lymphadenopathy   Psychiatric: Normal affect, behavior     Labs:    I have reviewed all lab results.  No results found for this or any previous visit (from the past 24 hour(s)).    Imaging Studies:    {IMAGE BBCWUG:[id number]}            Assessment/Recommendations:   [name] is a 22 y.o. female who presents with ***      ***    Thank you for allowing [place] to participate in the care of [name], [name]. Feel free to contact us with questions or concerns. Will continue to follow.     Tonya Hillier, DO  11/05/2021, 23:12    [facility]       On the day of the encounter, a total of *** minutes was spent on this patient encounter including review of historical information, examination, documentation and post-visit activities.  I have reviewed old labs, images or records myself.

## 2021-11-06 ENCOUNTER — Ambulatory Visit (INDEPENDENT_AMBULATORY_CARE_PROVIDER_SITE_OTHER): Payer: Self-pay | Admitting: Pediatric Hematology-Oncology

## 2021-12-09 ENCOUNTER — Ambulatory Visit (HOSPITAL_BASED_OUTPATIENT_CLINIC_OR_DEPARTMENT_OTHER): Payer: Self-pay | Admitting: Student in an Organized Health Care Education/Training Program

## 2021-12-09 NOTE — Progress Notes (Deleted)
Department of Obstetrics and Gynecology         Name of Patient: [name]  DOB: 06/12/2001  MRN: [id number]    Date of Service: 12/09/2021        Subjective:    21 y.o. [id number]     No chief complaint on file.      REVIEW OF SYSTEMS :  ***  Constitutional: Negative. Denies any fever, chills or fatigue   Skin: Negative.  Denies any rash or skin changes  HENT: Negative.  Denies any headaches, visual changes, swallowing issues  Cardiovascular: Negative.  Denies any chest pain or palpitations  Respiratory: Negative.  Denies any difficulty breathing or cough  Gastrointestinal: Negative.  Denies any change in bowel habits, nausea or vomiting  Genitourinary:        See HPI   Musculoskeletal: Negative.   Neurological: Negative.    Psychiatric: Negative.      Patient's PMH/PSH/FH/SH/Meds/Allergies were all reviewed and noted in the EPIC chart on today's visit.    Past Medical History:   Diagnosis Date   . Attention deficit disorder 08/26/2011   . Chest pain     Has had negative nonnuclear stress tests in October of 2008 and March 2001. Last Holter February 13 demonstrated 13 PVC's with one couplets and normal low and high rates.    . Constipation     Has been seen multiple times by GI for this.    . Fallot tetralogy    . Fever of unknown origin 08/30/2007   . GERD (gastroesophageal reflux disease)    . Headache    . Heart disease    . Impaired hearing     Past history of secretory otitis.    . Otitis media    . Piercing    . Psychiatric problem    . Rash     Eczema in scalp   . Seizure (CMS [facility])     Treated in the past for seizures and followed by a neurologist.   . STD (sexually transmitted disease) 07/2015    chlamydia   . Thalassemia trait    . Unspecified deficiency anemia     Hemoglobin electrophoresis has shown 87% A, 3.5% A2 and 9% fetal in past.  Patient eats one meal a day. She eats ice.          Past Surgical History:   Procedure Laterality Date   . HX HEART SURGERY     . PB INJECT W CARD CATH LV/LA  ANGIO     . PB REPLACEMENT, PULMONARY VALVE     . PB REPR ASD W BYPASS     . PB REPR TET FALLOT W PULM ATRESIA           Family Medical History:     Problem Relation (Age of Onset)    ADHD/ADD Other    Congestive Heart Failure Other    Mental Retardation Other    Multiple Sclerosis Other    SLE Other    Seizures Brother          Current Outpatient Medications   Medication Sig Dispense Refill   . aspirin (ECOTRIN) 81 mg Oral Tablet, Delayed Release (E.C.) Take 81 mg by mouth Once a day (Patient not taking: No sig reported)     . docusate sodium (COLACE) 100 mg Oral Capsule Take 1 Cap (100 mg total) by mouth Twice daily (Patient not taking: No sig reported) 60 Cap 2   . hydrocortisone 2.5 % Apply  externally cream with perineal applicator by Rectal route Twice daily 28.35 g 0   . hydrocortisone acetate (ANUSOL-HC) 25 mg Rectal Suppository 1 Suppository (25 mg total) by Rectal route Twice per day as needed For internal hemorrhoids (Patient not taking: No sig reported) 12 Suppository 0   . hydrOXYzine HCl (ATARAX) 25 mg Oral Tablet Take 1 Tab (25 mg total) by mouth Three times a day as needed for Itching (Patient not taking: No sig reported) 60 Tab 1   . Ibuprofen (MOTRIN) 600 mg Oral Tablet Take 1 Tab (600 mg total) by mouth Four times a day as needed for Pain 60 Tab 2   . prenatal vitamin-iron-folate Tablet Take 1 Tab by mouth Once a day (Patient not taking: No sig reported)     . raNITIdine (ZANTAC) 150 mg Oral Tablet Take 1 Tab (150 mg total) by mouth Twice daily For acid reflux (Patient not taking: No sig reported) 60 Tab 2   . valACYclovir (VALTREX) 1 gram Oral Tablet  (Patient not taking: Reported on 09/23/2021)       No current facility-administered medications for this visit.     No Known Allergies  Social History     Socioeconomic History   . Marital status: Single     Spouse name: Not on file   . Number of children: Not on file   . Years of education: 459   . Highest education level: Not on file   Occupational  History   . Occupation: Consulting civil engineerstudent     Comment: 9th grade, repeating   Tobacco Use   . Smoking status: Every Day     Types: Cigarettes   . Smokeless tobacco: Never   . Tobacco comments:     Former smoker - stopped when pregnant    Substance and Sexual Activity   . Alcohol use: No     Alcohol/week: 0.0 standard drinks   . Drug use: No   . Sexual activity: Yes     Partners: Male     Birth control/protection: None   Other Topics Concern   . Exercise Concern Not Asked   . Seat Belt Not Asked   . Special Diet Not Asked   . Caffeine Concern Not Asked   . Abuse/Domestic Violence Not Asked   . Right hand dominant Yes   . Left hand dominant Not Asked   . Ambidextrous Not Asked   . Breast Self Exam Not Asked   . Calcium intake adequate Not Asked   . Computer Use Not Asked   . Drives Not Asked   . Helmet Use Not Asked   . Sunscreen used Not Asked   . Uses Cane Not Asked   . Uses walker Not Asked   . Uses wheelchair Not Asked   . Shift Work Not Asked   . Unusual Sleep-Wake Schedule Not Asked   Social History Narrative   . Not on file     Social Determinants of Health     Financial Resource Strain: Not on file   Transportation Needs: Not on file   Social Connections: Not on file   Intimate Partner Violence: Not on file   Housing Stability: Not on file           Objective:  There were no vitals taken for this visit.      Physical Exam:    Constitutional: She is oriented. She appears well-developed and well-nourished.     HENT: Head: Normocephalic.     Abdomen:  She exhibits no distension. Soft. No tenderness. She has no rebound and no guarding.     Psychiatric: She has a normal mood and affect. Her behavior is normal.     Extremities:  ***No edema    Skin:  normal    GU:  External Exam: External exam normal.    No urethral tenderness.  No genital lesions present.    Vaginal Exam:  Vagina normal to exam.    Bladder:  Bladder is normal to exam. Suprapubic fullness not present.    Uterus: Uterus is normal to exam.  Cervix is normal to  exam.  Position: ***   Contour:  Regular.    Mobility:  Mobile.    Adnexa:  No palpable masses and no tenderness.    Rectovaginal exam:  ***    Assessment & Plan:   No diagnosis found.    No orders of the defined types were placed in this encounter.      No follow-ups on file.    ***    Rachelle Hora, MD  [facility]  Obstetrics and Gynecology

## 2022-03-30 ENCOUNTER — Ambulatory Visit (INDEPENDENT_AMBULATORY_CARE_PROVIDER_SITE_OTHER): Payer: Self-pay | Admitting: Internal Medicine

## 2022-04-05 ENCOUNTER — Other Ambulatory Visit (INDEPENDENT_AMBULATORY_CARE_PROVIDER_SITE_OTHER): Payer: Self-pay | Admitting: Internal Medicine

## 2022-04-05 DIAGNOSIS — D563 Thalassemia minor: Secondary | ICD-10-CM

## 2022-04-05 DIAGNOSIS — H9190 Unspecified hearing loss, unspecified ear: Secondary | ICD-10-CM

## 2022-04-05 DIAGNOSIS — Z952 Presence of prosthetic heart valve: Secondary | ICD-10-CM

## 2022-04-05 DIAGNOSIS — Q213 Tetralogy of Fallot: Secondary | ICD-10-CM

## 2022-04-06 ENCOUNTER — Ambulatory Visit (INDEPENDENT_AMBULATORY_CARE_PROVIDER_SITE_OTHER): Payer: Self-pay

## 2022-04-06 ENCOUNTER — Ambulatory Visit (INDEPENDENT_AMBULATORY_CARE_PROVIDER_SITE_OTHER): Payer: Medicaid Other | Admitting: Internal Medicine

## 2022-05-24 ENCOUNTER — Other Ambulatory Visit (INDEPENDENT_AMBULATORY_CARE_PROVIDER_SITE_OTHER): Payer: Self-pay | Admitting: PEDIATRICS-PEDIATRIC CARDIOLOGY

## 2022-05-24 DIAGNOSIS — Q213 Tetralogy of Fallot: Secondary | ICD-10-CM

## 2022-05-24 DIAGNOSIS — Z952 Presence of prosthetic heart valve: Secondary | ICD-10-CM

## 2022-05-25 ENCOUNTER — Ambulatory Visit (INDEPENDENT_AMBULATORY_CARE_PROVIDER_SITE_OTHER): Payer: Self-pay

## 2022-05-25 ENCOUNTER — Ambulatory Visit (INDEPENDENT_AMBULATORY_CARE_PROVIDER_SITE_OTHER): Payer: Medicaid Other | Admitting: PEDIATRICS-PEDIATRIC CARDIOLOGY

## 2022-07-15 ENCOUNTER — Ambulatory Visit (INDEPENDENT_AMBULATORY_CARE_PROVIDER_SITE_OTHER): Payer: Self-pay

## 2022-07-15 ENCOUNTER — Ambulatory Visit (INDEPENDENT_AMBULATORY_CARE_PROVIDER_SITE_OTHER): Payer: Self-pay | Admitting: Internal Medicine

## 2022-07-26 ENCOUNTER — Other Ambulatory Visit (INDEPENDENT_AMBULATORY_CARE_PROVIDER_SITE_OTHER): Payer: Self-pay | Admitting: Internal Medicine

## 2022-07-26 DIAGNOSIS — Q213 Tetralogy of Fallot: Secondary | ICD-10-CM

## 2022-07-26 DIAGNOSIS — D561 Beta thalassemia: Secondary | ICD-10-CM

## 2022-07-26 DIAGNOSIS — Z952 Presence of prosthetic heart valve: Secondary | ICD-10-CM

## 2022-07-27 ENCOUNTER — Ambulatory Visit: Payer: 59 | Attending: Internal Medicine | Admitting: Internal Medicine

## 2022-07-27 ENCOUNTER — Encounter (INDEPENDENT_AMBULATORY_CARE_PROVIDER_SITE_OTHER): Payer: Self-pay | Admitting: Internal Medicine

## 2022-07-27 ENCOUNTER — Ambulatory Visit (HOSPITAL_BASED_OUTPATIENT_CLINIC_OR_DEPARTMENT_OTHER)
Admission: RE | Admit: 2022-07-27 | Discharge: 2022-07-27 | Disposition: A | Discharge status: Home / Self Care | Payer: Medicaid Other | Source: Ambulatory Visit

## 2022-07-27 ENCOUNTER — Other Ambulatory Visit: Payer: Self-pay

## 2022-07-27 ENCOUNTER — Encounter (INDEPENDENT_AMBULATORY_CARE_PROVIDER_SITE_OTHER): Payer: Self-pay

## 2022-07-27 VITALS — BP 129/75 | HR 84 | Temp 96.8°F | Ht 62.0 in | Wt 123.2 lb

## 2022-07-27 DIAGNOSIS — D563 Thalassemia minor: Secondary | ICD-10-CM | POA: Insufficient documentation

## 2022-07-27 DIAGNOSIS — D561 Beta thalassemia: Secondary | ICD-10-CM

## 2022-07-27 DIAGNOSIS — Z952 Presence of prosthetic heart valve: Secondary | ICD-10-CM

## 2022-07-27 DIAGNOSIS — H9190 Unspecified hearing loss, unspecified ear: Secondary | ICD-10-CM

## 2022-07-27 DIAGNOSIS — I517 Cardiomegaly: Secondary | ICD-10-CM | POA: Insufficient documentation

## 2022-07-27 DIAGNOSIS — Q213 Tetralogy of Fallot: Secondary | ICD-10-CM

## 2022-07-27 DIAGNOSIS — O99413 Diseases of the circulatory system complicating pregnancy, third trimester: Secondary | ICD-10-CM | POA: Insufficient documentation

## 2022-07-27 LAB — PEDIATRIC TRANSTHORACIC ECHO (IN CLINIC)
AV LVOT peak gradient: 3 mmHg
Left Ventricular Outflow Tract Peak Velocity: 86.4 cm/s

## 2022-07-27 NOTE — Progress Notes (Signed)
general     [facility]   ACHD Clinic Note            Date:              07/27/2022  Name:            [name]  Age:               22 y.o.  DOB:            03-03-2001     Chief Complaint:   Chief Complaint   Patient presents with    Follow Up     TOF absent pulmonary valve s/p PVR         History of Present Illness:     I was asked to see this patient by Primary Care Provider Tonya Cooper, DO to consult regarding cardiac issues.  Patient is a 22 y.o.  female who presents with her Mother for follow up of CHD.    [name] has been seen by Dr. Hardin [name] & dr. Jenean [name]. 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    She was born at 6 weeks 6 days GA. When she was born there were no signs of something wrong. However, at 58 old she had an episode of Cyanosis. Diagnosis was obtained.  She was seen by Dr. Jenean [name] who placed a heart stent at nine months of age.  At 22 years of age they placed a 53 gauge tissue valve.   At the age of 8 she had open heart surgery where they replaced her pulmonary valve with a pig valve.  Her mother reports that with each valve surgery they expected the valve to last ~10 years. They have consistently lasted for 7 years before needing to be replaced.    [name] was also told that she would be unable to have children, however, she now has two children. The only issue with delivery was very low blood pressure. Both pregnancies were Vaginal and Induced.  For the last portion of her pregnancy with her second child she required constant monitoring. Her children are both from different fathers and they are semi-involved with the children.  On 2022 She went to the hospital  to issue with the whole left side of her body feeling heavy and limp. She called an ambulance immediately after calling her mother, the EMT's noted signs of a stroke. She was taken to [facility] where they reported that she did not have a stroke but said that she should be seen by a  cardiologist as soon as possible. She did not have any similar issues prior to this episode. It took her around 3 hours to return to her normal self. for that she was seen and cleared by neurology who referred that to the migraine as per the patient  ( reports not available to me )   [name] had her first migraine 8-9 years ago. They are always focused on the left side of her head and the left side of her neck. She generally requires the lights to be off and minimal noise to get over these headaches.Her brother and uncle both have asthma.  She has no activity restriction, ADL,  No history of asthma. gained/ lost weight   Has 7 siblings who are healthy.  Has good appetite and sleeps well.   Patient and mother deny:  Diaphoresis, Respiratory Distress, Exercise Intolerance, Chest Pain, Palpitations, Syncope, Edema.  The  rectal prolapse turned  to be hemorrhoids that were treated and she is feeling much better.    She c/o of swelling of her feet with no SOB.      Past Medical /Surgical History:      Past Medical History:   Diagnosis Date    Attention deficit disorder 08/26/2011    Chest pain     Has had negative nonnuclear stress tests in October of 2008 and March 2001. Last Holter February 13 demonstrated 13 PVC's with one couplets and normal low and high rates.     Constipation     Has been seen multiple times by GI for this.     Fallot tetralogy     Fever of unknown origin 08/30/2007    GERD (gastroesophageal reflux disease)     Headache     Heart disease     Impaired hearing     Past history of secretory otitis.     Otitis media     Piercing     Psychiatric problem     Rash     Eczema in scalp    Seizure (CMS HCC)     Treated in the past for seizures and followed by a neurologist.    STD (sexually transmitted disease) 07/2015    chlamydia    Thalassemia trait     Unspecified deficiency anemia     Hemoglobin electrophoresis has shown 87% A, 3.5% A2 and 9% fetal in past.  Patient eats one meal a day. She eats ice.           Past Surgical History:   Procedure Laterality Date    HX HEART SURGERY      PB INJECT W CARD CATH LV/LA ANGIO      PB REPLACEMENT, PULMONARY VALVE      PB REPR ASD W BYPASS      PB REPR TET FALLOT W PULM ATRESIA           Family History:       Family Medical History:       Problem Relation (Age of Onset)    ADHD/ADD Other    Congestive Heart Failure Other    Mental Retardation Other    Multiple Sclerosis Other    SLE Other    Seizures Brother          No family history of Congenital Heart Defects.  No family history of sudden cardiac deaths.  +Family history of Migraines in her mother.  +Family history of Asthma    Social History:     Social History    Foster child Yes     Smoke exposure No     Patients' primary caregiver? Dr. Cassell [name]     Lives with Biologic Parent No         Medications       Current Outpatient Medications:     aspirin (ECOTRIN) 81 mg Oral Tablet, Delayed Release (E.C.), Take 81 mg by mouth Once a day (Patient not taking: Reported on 11/27/2020), Disp: , Rfl:     docusate sodium (COLACE) 100 mg Oral Capsule, Take 1 Cap (100 mg total) by mouth Twice daily (Patient not taking: Reported on 08/16/2017), Disp: 60 Cap, Rfl: 2    hydrocortisone 2.5 % Apply externally cream with perineal applicator, by Rectal route Twice daily, Disp: 28.35 g, Rfl: 0    hydrocortisone acetate (ANUSOL-HC) 25 mg Rectal Suppository, 1 Suppository (25 mg total) by Rectal route Twice per day as needed For internal hemorrhoids (Patient not  taking: Reported on 08/16/2017), Disp: 12 Suppository, Rfl: 0    hydrOXYzine HCl (ATARAX) 25 mg Oral Tablet, Take 1 Tab (25 mg total) by mouth Three times a day as needed for Itching (Patient not taking: Reported on 11/27/2020), Disp: 60 Tab, Rfl: 1    Ibuprofen (MOTRIN) 600 mg Oral Tablet, Take 1 Tab (600 mg total) by mouth Four times a day as needed for Pain, Disp: 60 Tab, Rfl: 2    prenatal vitamin-iron-folate Tablet, Take 1 Tab by mouth Once a day (Patient not taking: Reported on  11/27/2020), Disp: , Rfl:     raNITIdine (ZANTAC) 150 mg Oral Tablet, Take 1 Tab (150 mg total) by mouth Twice daily For acid reflux (Patient not taking: Reported on 11/27/2020), Disp: 60 Tab, Rfl: 2    valACYclovir (VALTREX) 1 gram Oral Tablet, , Disp: , Rfl:     Review of Systems:      CONSTITUTIONAL:  Negative   EYES:  Negative  ENT: Negative   CARDIOVASCULAR:  see HPI  RESPIRATORY:  see HPI  NEUROLOGICAL:  +Migraines w/ aura.  GI:  hx of hemorroids, improved   GU: Negative for frequency  ENDOCRINE:  Negative  SKIN:  Negative   MUSCULOSKELETAL:  Negative   HEM/LYMPH:  Negative   ALLERGIC/IMM:  Negative  PSYCHIATRIC:  Negative for bad mood     Physical Examination:     Vitals:    07/27/22 1519   BP: 129/75   Pulse: 84   Temp: 36 C (96.8 F)   TempSrc: Thermal Scan   SpO2: 99%   Weight: 55.9 kg (123 lb 3.8 oz)   Height: 1.575 m (5\' 2" )   BMI: 22.59        CONSTITUTIONAL: Non dysmorphic   GENERAL: Patient is in no acute distress, no cyanosis or jaundice.   HEAD: Head is normocephalic, atraumatic. No palpable masses.  EYES: PERRL, no conjunctival injection, no pallor or icterus  ENT: NC/AT, no rhinorrhea, normal appearing nasal septum, normal turbinates, normal ear position, OP clear, MMM, no significant dental carries.   NECK:  Supple, Trachea is midline. No masses are palpated.  LYMPH: No lymphadenopathy palpable in the neck.  RESPIRATORY:  Symmetric chest expansion, easy work of breathing, clear to auscultation bilaterally, no retraction, no wheezing rhonchi or crackles.    CARDIOVASCULAR:  Normoactive precordium, no heave, no JVD, regular rhythm, Nml S1, Nml and physiologically split S2; has a soft ESM at LSB  No gallop or clicks,  or rub. 2+ and equal radia/femoral/ DP pulses bilaterally. No radiofemoral delay, CR`2secs, no extremities edema or cyanosis  GI: Soft, non tender, non distended, no significant hepatosplenomegaly.   NEUROLOGICAL: No focal deficit, grossly normal strength and tone.   SKIN: Skin is warm  and dry to touch. no rashes  MUSCULOSKELETAL: Extremities move equally well.no chest wall tenderness.   PSYCHIATRIC: Patient is pleasant, cooperative and alert.      Labs      I personnally reviewed the labs of the patient, will need new labs.      Data Reviewed:     ECG: 07/27/2022: reviewed and interpreted by myself:  Sinus rhythm, Left axis deviation, RBBB with secondary T wave inversion   ECHO 07/27/2022: supervised and reviewed by myself; my interpretation is as follows:   Pt with TOF with absent pulmonary valve syndrome by hx.  No residual intracardiac shunt.  Good s function of both ventricles.  Only mild RV dilatation. The IVC is dilated and totally collapsible  There is a bioprosthetic pulmonary valve (#29 porcine) with thickened leaflets that are functioning well. Mild gradient  across the valve, no significant regurgitation. . PS gradient is 20 mm hg peak and 12 mm Hg mean.  She has a retroaortic innominate vein   Dilated PAS     MRI/MRA:  CTA 09/23/21:   Impression:     1.  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 She underwent complete correction of the defect on July 27, 2000, with Dacron patch closure of a large malalignment ventricular septal defect,   infundibular muscle resection and transannular right ventricular outflow tract pericardial patch, as well as primary suture closure of a moderate-sized secundum atrial septal defect.   She later underwent pulmonary valve replacement with a #27 Carpentier-Edwards pericardial bioprosthetic valve and bovine pericardial patch angioplasty of the main pulmonary artery on August 18, 2007.  On 02/01/2014 she underwent excision of the old #27 Carpentier-Edwards bioprosthetic pulmonary valve, pulmonary valve replacement with a #29 Medtronic Mosaic porcine bioprosthetic pulmonary valve, bovine pericardial patch angioplasty of the main pulmonary artery at the pulmonary valve   insertion site     2. Qualitatively mildly dilated  right  ventricular size.     3. The prosthetic pulmonary valve is well seated. Short dilated main pulmonary artery with contiguous branch pulmonary arteries.  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 z score 6.4   The LPA is short and moderately  dilated. z score 4  The branch pulmonary arteries are widely patent without focal areas of narrowing or stenosis.     4. 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    Holter 09/23/21:   Indication: ToF, palpitations, Beta-Thalassemia, Non rheumatic pulmonary valve insufficincy     Patient had a min HR of 61 bpm, max HR of 175 bpm, and avg HR of 97 bpm. Predominant underlying rhythm was Sinus Rhythm. Bundle Branch Block/IVCD was present. Isolated SVEs were rare (<1.0%), and no SVE Couplets or SVE Triplets were present. Isolated VEs were rare (<1.0%), VE Couplets were rare (<1.0%), and no VE Triplets were present. Ventricular Trigeminy was present.  CPT:  Cath:    ACHD AP classification:    CHD anatomy:  I.  Simple: small ASD, VSD, mild PS, previous PDA ligation. repaired ASD & VSD no residue   II Moderate: repaired or unrepaired:   Ao/LV fistula; APVR; Anomalous coronary; AVSD p/c; AoVD v/subv; MVD; coarc; Ebstein; Mod to Large unrepaired ASD/PDA/VSD ;PR/PS mode/sev; Sinus of V fistula/aneur; RTOF; straddling AVV  III Complex: cyanotic unrepaired or palliated; DORV; Fontan; IAA, M atresia; P atresia; TGA D/L; SV; TA; Isomerism   Physiologic stage:  A:  NYHA I; no hemodynamic or anatomic sequale; no arhythmia; Normal ex; Normal R/H/P function   B: NYHA II; Mild hemodyn mild Ao dil, mild vent dysf, mild vent dil; mild val disease; Arrhythmia no ttt; objective limitation to ex.   C: NYHA III; > mod valve disease; . mod vent dysf; Mod AO dil, mild/mod hypoxia; Vein or art stenosis;> mod shunt; mod PH; arrhythmia on ttt; end organ dysf responsive to ttt  D: NYHA IV; sev ao dil; arrhythmia refractory to ttt; sev PH; sev Hypoxia; Eisem; refractory end organ  dysf.    Patient is class II stage A-B      Assessment and Plan:      Leny Bulloch 22 y.o.  has Hx of TOF & absent pulmonary valve syndrome  noted at  birth. She underwent complete correction of the defect on July 27, 2000, with Dacron patch closure of a large malalignment ventricular septal defect, infundibular muscle resection and transannular right ventricular outflow tract pericardial patch, as well as primary suture closure of a moderate-sized secundum atrial septal defect.   She later underwent pulmonary valve replacement with a #27 Carpentier-Edwards pericardial bioprosthetic valve and bovine pericardial patch angioplasty of the main pulmonary artery on August 18, 2007.  On 02/01/2014 she underwent excision of the old #27 Carpentier-Edwards bioprosthetic pulmonary valve, pulmonary valve replacement with a #29 Medtronic Mosaic porcine bioprosthetic pulmonary valve, bovine pericardial patch angioplasty of the main pulmonary artery at the pulmonary valve insertion site. Her heart condition is stable today.  She gave birth to 2 children who are healthy.  She did not have problems and did well after its own.  From the card point of view she is still doing well.    She got her CTA 2023, reported above   She needs to see  hemonc for B thalassemias ( i spoke with Dr Rogelio Seen in ped hematology who will be following her)   Holter monitor 09/2021 14 days showed no significant arrhythmia    will continue to follow and in the future will obtain an MRI MRA for better assessment of the ventricular function as well as the pulmonary valve.  He vit D is low   Will order a LL venous duplex to rule out varicosities   I recommended that she uses anti fatigue stockings specially on standing for long time   She is starting a new job at [employer].    She can work with limitation of weight lifting up to 25 lbs. No activity to let her strain        PLAN:    LL venous Duplex   Refer to hematology ( Dr. Rogelio Seen ped heme)   F/U in  6 months  Medications: Aspirin   No Activity Restrictions  She needs SBE Prophylaxis according to most recent AHA guidelines.   No contraindications for any interventional procedures or need for anesthesia from the cardiac stand point       I have discussed the plan with the family. All their question has been answered. Discharge Instructions were emphasized to the family by myself and the clinical staff.  Her mother will call if any problem/issues arise.   I spent more than 50 % of this 60 min visit face to face with the family counseling and coordinating care.   I was supervising the echo and showed pictures to the family. I drew diagrams to better explain the pathology and intervention. I explained the plan and the steps to follow.   I discussed life style modification healthy diet, decrease weight, decrease salt and exercise and the benefit of each on the cardiovascular system.       Jaclyn Prime, MD  ACHD Program Director  Co Director CPET lab   Associate Professor  Section of Pediatric Cardiology  [facility] Department of Pediatrics

## 2022-07-28 ENCOUNTER — Encounter (INDEPENDENT_AMBULATORY_CARE_PROVIDER_SITE_OTHER): Payer: Self-pay | Admitting: Internal Medicine

## 2022-07-30 ENCOUNTER — Other Ambulatory Visit (INDEPENDENT_AMBULATORY_CARE_PROVIDER_SITE_OTHER): Payer: Self-pay

## 2022-07-30 ENCOUNTER — Encounter (INDEPENDENT_AMBULATORY_CARE_PROVIDER_SITE_OTHER): Payer: Self-pay

## 2022-07-30 DIAGNOSIS — Q213 Tetralogy of Fallot: Secondary | ICD-10-CM

## 2022-07-30 DIAGNOSIS — I371 Nonrheumatic pulmonary valve insufficiency: Secondary | ICD-10-CM

## 2022-07-30 DIAGNOSIS — I8393 Asymptomatic varicose veins of bilateral lower extremities: Secondary | ICD-10-CM

## 2022-07-31 NOTE — Nursing Note (Signed)
DME order placed for compression stockings per Dr. Lovette Cliche. Discussed medium compression and knee high length. Birdie Sons, MSW to further assist.  Saunders Glance, RN

## 2022-07-31 NOTE — Addendum Note (Signed)
Addended by: Candi Leash on: 07/31/2022 02:22 PM     Modules accepted: Orders

## 2022-08-03 ENCOUNTER — Encounter (INDEPENDENT_AMBULATORY_CARE_PROVIDER_SITE_OTHER): Payer: Self-pay

## 2022-08-20 DIAGNOSIS — R9431 Abnormal electrocardiogram [ECG] [EKG]: Secondary | ICD-10-CM

## 2022-08-20 DIAGNOSIS — I4510 Unspecified right bundle-branch block: Secondary | ICD-10-CM

## 2022-08-20 LAB — ECG 12-LEAD - PEDS (FOR USE IN PED CARD CLINIC ONLY)
Atrial Rate: 83 {beats}/min
Calculated P Axis: 36 degrees
Calculated R Axis: 4 degrees
Calculated T Axis: 57 degrees
PR Interval: 168 ms
QRS Duration: 144 ms
QT Interval: 420 ms
QTC Calculation: 493 ms
Ventricular rate: 83 {beats}/min

## 2022-09-30 ENCOUNTER — Ambulatory Visit (INDEPENDENT_AMBULATORY_CARE_PROVIDER_SITE_OTHER): Payer: Self-pay | Admitting: PEDIATRICS-PEDIATRIC HEMATOLOGY-ONCOLOGY

## 2022-09-30 ENCOUNTER — Ambulatory Visit (HOSPITAL_COMMUNITY): Payer: Self-pay

## 2022-10-09 ENCOUNTER — Ambulatory Visit (HOSPITAL_COMMUNITY): Payer: Self-pay

## 2022-10-12 ENCOUNTER — Ambulatory Visit (HOSPITAL_COMMUNITY): Payer: Self-pay

## 2022-10-26 ENCOUNTER — Ambulatory Visit (INDEPENDENT_AMBULATORY_CARE_PROVIDER_SITE_OTHER): Payer: Self-pay | Admitting: PEDIATRICS-PEDIATRIC HEMATOLOGY-ONCOLOGY

## 2022-10-26 ENCOUNTER — Ambulatory Visit (HOSPITAL_COMMUNITY): Payer: Self-pay

## 2022-10-28 ENCOUNTER — Emergency Department (HOSPITAL_BASED_OUTPATIENT_CLINIC_OR_DEPARTMENT_OTHER)
Admission: EM | Admit: 2022-10-28 | Discharge: 2022-10-29 | Disposition: A | Discharge status: Home / Self Care | Payer: Medicaid - Out of State | Attending: Emergency Medicine | Admitting: Emergency Medicine

## 2022-10-28 ENCOUNTER — Other Ambulatory Visit: Payer: Self-pay

## 2022-10-28 DIAGNOSIS — R519 Headache, unspecified: Secondary | ICD-10-CM | POA: Diagnosis present

## 2022-10-28 DIAGNOSIS — Z87891 Personal history of nicotine dependence: Secondary | ICD-10-CM | POA: Diagnosis not present

## 2022-10-28 DIAGNOSIS — G44309 Post-traumatic headache, unspecified, not intractable: Secondary | ICD-10-CM | POA: Diagnosis not present

## 2022-10-28 DIAGNOSIS — R079 Chest pain, unspecified: Secondary | ICD-10-CM

## 2022-10-29 ENCOUNTER — Emergency Department (HOSPITAL_BASED_OUTPATIENT_CLINIC_OR_DEPARTMENT_OTHER): Payer: Medicaid - Out of State | Admitting: Radiology

## 2022-10-29 ENCOUNTER — Emergency Department (HOSPITAL_BASED_OUTPATIENT_CLINIC_OR_DEPARTMENT_OTHER): Payer: Medicaid - Out of State

## 2022-10-29 ENCOUNTER — Other Ambulatory Visit: Payer: Self-pay

## 2022-10-29 LAB — BASIC METABOLIC PANEL
Anion gap: 9 (ref 5–15)
BUN: 17 mg/dL (ref 6–20)
CO2: 21 mmol/L — ABNORMAL LOW (ref 22–32)
Calcium: 9.6 mg/dL (ref 8.9–10.3)
Chloride: 107 mmol/L (ref 98–111)
Creatinine, Ser: 0.74 mg/dL (ref 0.44–1.00)
GFR, Estimated: >60 mL/min (ref >60)
Glucose, Bld: 86 mg/dL (ref 70–99)
Potassium: 3.3 mmol/L — ABNORMAL LOW (ref 3.5–5.1)
Sodium: 137 mmol/L (ref 135–145)

## 2022-10-29 LAB — CBC
HCT: 37.9 % (ref 36.0–46.0)
Hemoglobin: 12.7 g/dL (ref 12.0–15.0)
MCH: 26.7 pg (ref 26.0–34.0)
MCHC: 33.5 g/dL (ref 30.0–36.0)
MCV: 79.6 fL — ABNORMAL LOW (ref 80.0–100.0)
Platelets: 332 10*3/uL (ref 150–400)
RBC: 4.76 MIL/uL (ref 3.87–5.11)
RDW: 13.9 % (ref 11.5–15.5)
WBC: 9.5 10*3/uL (ref 4.0–10.5)
nRBC: 0 % (ref 0.0–0.2)

## 2022-10-29 LAB — D-DIMER, QUANTITATIVE: D-Dimer, Quant: <0.27 ug/mL-FEU (ref 0.00–0.50)

## 2022-10-29 LAB — TROPONIN I (HIGH SENSITIVITY): Troponin I (High Sensitivity): <2 ng/L (ref <18)

## 2022-10-29 LAB — PREGNANCY, URINE: Preg Test, Ur: NEGATIVE

## 2022-10-29 MED ORDER — ACETAMINOPHEN 500 MG PO TABS
1000.0000 mg | ORAL_TABLET | Freq: Once | ORAL | Status: AC
  Administered 2022-10-29: 1000 mg via ORAL
  Filled 2022-10-29: qty 2
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
## 2022-10-29 NOTE — ED Notes (Signed)
Patient transported to CT 

## 2022-10-29 NOTE — ED Notes (Signed)
RN reviewed discharge instructions with pt. Pt verbalized understanding and had no further questions. VSS upon discharge.  

## 2022-10-29 NOTE — Discharge Instructions (Signed)
You were evaluated in the Emergency Department and after careful evaluation, we did not find any emergent condition requiring admission or further testing in the hospital.  Your exam/testing today was overall reassuring.  Recommend follow-up with your cardiologist to discuss your chest pain.  Please return to the Emergency Department if you experience any worsening of your condition.  Thank you for allowing [place] to be a part of your care.

## 2022-10-29 NOTE — ED Triage Notes (Signed)
Reports L sided chest pain with onset 2345. Respirations are equal and nonlabored. Skin warm and dry.

## 2022-12-03 ENCOUNTER — Emergency Department (HOSPITAL_COMMUNITY)
Admission: EM | Admit: 2022-12-03 | Discharge: 2022-12-03 | Disposition: A | Discharge status: Home / Self Care | Payer: MEDICARE | Attending: Emergency Medicine | Admitting: Emergency Medicine

## 2022-12-03 DIAGNOSIS — N939 Abnormal uterine and vaginal bleeding, unspecified: Secondary | ICD-10-CM | POA: Diagnosis present

## 2022-12-03 DIAGNOSIS — Z5321 Procedure and treatment not carried out due to patient leaving prior to being seen by health care provider: Secondary | ICD-10-CM | POA: Diagnosis not present

## 2022-12-03 DIAGNOSIS — R110 Nausea: Secondary | ICD-10-CM | POA: Diagnosis not present

## 2022-12-03 LAB — COMPREHENSIVE METABOLIC PANEL
ALT: 20 U/L (ref 0–44)
AST: 15 U/L (ref 15–41)
Albumin: 4 g/dL (ref 3.5–5.0)
Alkaline Phosphatase: 56 U/L (ref 38–126)
Anion gap: 10 (ref 5–15)
BUN: 12 mg/dL (ref 6–20)
CO2: 22 mmol/L (ref 22–32)
Calcium: 9.3 mg/dL (ref 8.9–10.3)
Chloride: 105 mmol/L (ref 98–111)
Creatinine, Ser: 0.82 mg/dL (ref 0.44–1.00)
GFR, Estimated: >60 mL/min (ref >60)
Glucose, Bld: 93 mg/dL (ref 70–99)
Potassium: 3.9 mmol/L (ref 3.5–5.1)
Sodium: 137 mmol/L (ref 135–145)
Total Bilirubin: 0.8 mg/dL (ref 0.3–1.2)
Total Protein: 7 g/dL (ref 6.5–8.1)

## 2022-12-03 LAB — CBC
HCT: 40 % (ref 36.0–46.0)
Hemoglobin: 13.1 g/dL (ref 12.0–15.0)
MCH: 25.8 pg — ABNORMAL LOW (ref 26.0–34.0)
MCHC: 32.8 g/dL (ref 30.0–36.0)
MCV: 78.7 fL — ABNORMAL LOW (ref 80.0–100.0)
Platelets: 386 10*3/uL (ref 150–400)
RBC: 5.08 MIL/uL (ref 3.87–5.11)
RDW: 14.4 % (ref 11.5–15.5)
WBC: 7.4 10*3/uL (ref 4.0–10.5)
nRBC: 0 % (ref 0.0–0.2)

## 2022-12-03 LAB — I-STAT BETA HCG BLOOD, ED (MC, WL, AP ONLY): I-stat hCG, quantitative: <5 m[IU]/mL (ref <5)

## 2022-12-03 LAB — TYPE AND SCREEN
ABO/RH(D): O POS
Antibody Screen: NEGATIVE

## 2022-12-03 NOTE — ED Notes (Signed)
Pt handed this tech stickers and notified that she will be leaving. Taking pt OTF

## 2022-12-03 NOTE — ED Notes (Signed)
Patient left ED said she was only waiting on test results .

## 2022-12-03 NOTE — ED Triage Notes (Signed)
Pt c/o three weeks of heavy vaginal bleeding with large clots and lower abdominal pain. Pt also endorses nausea. States she is currently on Depo for [facility]. Pt reports soaking through her pads every 4-5 hours during this time.

## 2022-12-08 ENCOUNTER — Emergency Department (HOSPITAL_BASED_OUTPATIENT_CLINIC_OR_DEPARTMENT_OTHER)
Admission: EM | Admit: 2022-12-08 | Discharge: 2022-12-08 | Disposition: A | Discharge status: Home / Self Care | Payer: Medicaid - Out of State | Attending: Emergency Medicine | Admitting: Emergency Medicine

## 2022-12-08 ENCOUNTER — Emergency Department (HOSPITAL_BASED_OUTPATIENT_CLINIC_OR_DEPARTMENT_OTHER): Payer: Medicaid - Out of State

## 2022-12-08 ENCOUNTER — Encounter (HOSPITAL_BASED_OUTPATIENT_CLINIC_OR_DEPARTMENT_OTHER): Payer: Self-pay | Admitting: Emergency Medicine

## 2022-12-08 ENCOUNTER — Other Ambulatory Visit: Payer: Self-pay

## 2022-12-08 DIAGNOSIS — R1030 Lower abdominal pain, unspecified: Secondary | ICD-10-CM | POA: Diagnosis not present

## 2022-12-08 DIAGNOSIS — N939 Abnormal uterine and vaginal bleeding, unspecified: Secondary | ICD-10-CM | POA: Insufficient documentation

## 2022-12-08 DIAGNOSIS — Q213 Tetralogy of Fallot: Secondary | ICD-10-CM | POA: Insufficient documentation

## 2022-12-08 LAB — CBC
HCT: 38 % (ref 36.0–46.0)
Hemoglobin: 12.9 g/dL (ref 12.0–15.0)
MCH: 27 pg (ref 26.0–34.0)
MCHC: 33.9 g/dL (ref 30.0–36.0)
MCV: 79.5 fL — ABNORMAL LOW (ref 80.0–100.0)
Platelets: 335 10*3/uL (ref 150–400)
RBC: 4.78 MIL/uL (ref 3.87–5.11)
RDW: 14.4 % (ref 11.5–15.5)
WBC: 7.4 10*3/uL (ref 4.0–10.5)
nRBC: 0 % (ref 0.0–0.2)

## 2022-12-08 LAB — BASIC METABOLIC PANEL
Anion gap: 13 (ref 5–15)
BUN: 14 mg/dL (ref 6–20)
CO2: 22 mmol/L (ref 22–32)
Calcium: 10 mg/dL (ref 8.9–10.3)
Chloride: 103 mmol/L (ref 98–111)
Creatinine, Ser: 0.67 mg/dL (ref 0.44–1.00)
GFR, Estimated: >60 mL/min (ref >60)
Glucose, Bld: 89 mg/dL (ref 70–99)
Potassium: 3.7 mmol/L (ref 3.5–5.1)
Sodium: 138 mmol/L (ref 135–145)

## 2022-12-08 LAB — HCG, SERUM, QUALITATIVE: Preg, Serum: NEGATIVE

## 2022-12-08 NOTE — ED Triage Notes (Signed)
Heavy vaginal bleeding and abd cramping x 4 weeks With clots

## 2022-12-08 NOTE — ED Provider Notes (Signed)
[place] EMERGENCY DEPARTMENT AT [facility] Provider Note   CSN: [id number] Arrival date & time: 12/08/22  1952     History  Chief Complaint  Patient presents with   Vaginal Bleeding    Claire Christensen is a 22 y.o. female.  Patient presents to the emergency room with a chief complaint of heavy vaginal bleeding with abdominal cramping.  Patient states she has had symptoms for approximately 4 weeks.  She states she has been feeling 7 pads daily.  She endorses clotting.  She denies nausea, vomiting, diarrhea, urinary symptoms, shortness of breath, weakness, chest pain.  Past medical history significant for tetralogy of Fallot  HPI     Home Medications Prior to Admission medications   Medication Sig Start Date End Date Taking? Authorizing Provider  acetaminophen (TYLENOL) 500 MG tablet Take 500 mg by mouth every 6 (six) hours as needed.    [provider]  aspirin 81 MG chewable tablet Chew by mouth daily.    [provider]  diphenhydrAMINE (BENADRYL) 25 MG tablet Take 1 tablet (25 mg total) by mouth every 6 (six) hours as needed. 08/24/19   Aviva Signs, CNM  ondansetron (ZOFRAN ODT) 4 MG disintegrating tablet Take 1 tablet (4 mg total) by mouth every 8 (eight) hours as needed for nausea or vomiting. 04/16/20   Donette Emanuelle Bastos, CNM  Prenatal Vit-Fe Fumarate-FA (PRENATAL COMPLETE) 14-0.4 MG TABS Take 1 tablet by mouth daily. 08/16/19   Molpus, John, MD      Allergies    Zithromax [azithromycin], Augmentin [amoxicillin-pot clavulanate], Omnicef [cefdinir], Penicillins, and Flagyl [metronidazole]    Review of Systems   Review of Systems  Physical Exam Updated Vital Signs BP 122/87 (BP Location: Right Arm)   Pulse 81   Temp 97.8 F (36.6 C)   Resp 18   SpO2 100%  Physical Exam Vitals and nursing note reviewed.  Constitutional:      General: She is not in acute distress.    Appearance: She is well-developed.  HENT:     Head: Normocephalic  and atraumatic.  Eyes:     Conjunctiva/sclera: Conjunctivae normal.  Cardiovascular:     Rate and Rhythm: Normal rate and regular rhythm.     Heart sounds: No murmur heard. Pulmonary:     Effort: Pulmonary effort is normal. No respiratory distress.     Breath sounds: Normal breath sounds.  Abdominal:     Palpations: Abdomen is soft.     Tenderness: There is abdominal tenderness (Suprapubic).  Musculoskeletal:        General: No swelling.     Cervical back: Neck supple.  Skin:    General: Skin is warm and dry.     Capillary Refill: Capillary refill takes less than 2 seconds.  Neurological:     Mental Status: She is alert.  Psychiatric:        Mood and Affect: Mood normal.     ED Results / Procedures / Treatments   Labs (all labs ordered are listed, but only abnormal results are displayed) Labs Reviewed  CBC - Abnormal; Notable for the following components:      Result Value   MCV 79.5 (*)    All other components within normal limits  HCG, SERUM, QUALITATIVE  BASIC METABOLIC PANEL    EKG None  Radiology US Pelvis Complete  Result Date: 12/08/2022 CLINICAL DATA:  Heavy vaginal bleeding EXAM: TRANSABDOMINAL ULTRASOUND OF PELVIS TECHNIQUE: Transabdominal ultrasound examination of the pelvis was performed including evaluation of  the uterus, ovaries, adnexal regions, and pelvic cul-de-sac. COMPARISON:  08/22/2019 FINDINGS: Uterus Measurements: 7.2 x 3.6 x 3.8 cm = volume: 52 mL. No fibroids or other mass visualized. Endometrium Thickness: 6 mm in the fundus, 9 mm in the lower uterine segment. No focal abnormality visualized. Right ovary Measurements: 3.0 x 1.9 x 2.8 cm = volume: 8.2 mL. Normal appearance/no adnexal mass. Left ovary Measurements: 2.4 x 1.7 x 2.3 cm = volume: 4.9 mL. Normal appearance/no adnexal mass. Other findings:  No abnormal free fluid. IMPRESSION: No acute findings or significant abnormality. Electronically Signed   By: Charlett Nose M.D.   On: 12/08/2022 22:12     Procedures Procedures    Medications Ordered in ED Medications - No data to display  ED Course/ Medical Decision Making/ A&P                             Medical Decision Making Amount and/or Complexity of Data Reviewed Labs: ordered. Radiology: ordered.   This patient presents to the ED for concern of vaginal bleeding, this involves an extensive number of treatment options, and is a complaint that carries with it a high risk of complications and morbidity.  The differential diagnosis includes abnormal uterine bleeding, ectopic pregnancy, malignancy, others   Co morbidities that complicate the patient evaluation  Tetralogy of Fallot   Additional history obtained:  Additional history obtained from family at bedside   Lab Tests:  I Ordered, and personally interpreted labs.  The pertinent results include: Grossly normal CBC, BMP, negative pregnancy test   Imaging Studies ordered:  I ordered imaging studies including pelvic ultrasound I independently visualized and interpreted imaging which showed no acute findings I agree with the radiologist interpretation   Social Determinants of Health:  Patient has no local PCP   Test / Admission - Considered:  Patient with abnormal uterine bleeding with no cause identified on pelvic ultrasound.  Negative pregnancy test.  No suspicion of ectopic pregnancy at this time.  Patient is not anemic at this time.  No indication for admission or further emergent workup.  Plan to discharge home with plans for follow-up with [facility].  Return precautions provided.         Final Clinical Impression(s) / ED Diagnoses Final diagnoses:  Vaginal bleeding    Rx / DC Orders ED Discharge Orders     None         Pamala Duffel 12/08/22 2221    Benjiman Core, MD 12/08/22 [phone]

## 2022-12-08 NOTE — Discharge Instructions (Signed)
You were evaluated today for vaginal bleeding.  Your workup was reassuring with no signs of anemia and no abnormalities noted on your pelvic ultrasound.  Please follow-up with the [facility] at [facility] for women.  You may call to schedule an appointment.  Return to the emergency department if you develop any life-threatening symptoms.

## 2023-02-02 ENCOUNTER — Ambulatory Visit
Admission: RE | Admit: 2023-02-02 | Discharge: 2023-02-02 | Disposition: A | Discharge status: Home / Self Care | Payer: Medicaid Other | Source: Ambulatory Visit | Attending: Internal Medicine | Admitting: Internal Medicine

## 2023-02-02 ENCOUNTER — Other Ambulatory Visit: Payer: Self-pay

## 2023-02-02 DIAGNOSIS — D561 Beta thalassemia: Secondary | ICD-10-CM | POA: Insufficient documentation

## 2023-02-02 DIAGNOSIS — Z952 Presence of prosthetic heart valve: Secondary | ICD-10-CM | POA: Insufficient documentation

## 2023-02-02 DIAGNOSIS — Q213 Tetralogy of Fallot: Secondary | ICD-10-CM | POA: Insufficient documentation

## 2023-02-02 DIAGNOSIS — D563 Thalassemia minor: Secondary | ICD-10-CM | POA: Insufficient documentation

## 2023-02-06 DIAGNOSIS — Z952 Presence of prosthetic heart valve: Secondary | ICD-10-CM

## 2023-02-06 DIAGNOSIS — D561 Beta thalassemia: Secondary | ICD-10-CM

## 2023-02-06 DIAGNOSIS — Q213 Tetralogy of Fallot: Secondary | ICD-10-CM

## 2023-02-06 DIAGNOSIS — D563 Thalassemia minor: Secondary | ICD-10-CM

## 2023-03-25 ENCOUNTER — Encounter (INDEPENDENT_AMBULATORY_CARE_PROVIDER_SITE_OTHER): Payer: Self-pay | Admitting: GENERAL SURGERY

## 2023-03-25 ENCOUNTER — Ambulatory Visit: Payer: Medicaid Other | Attending: GENERAL SURGERY | Admitting: GENERAL SURGERY

## 2023-03-25 ENCOUNTER — Other Ambulatory Visit: Payer: Self-pay

## 2023-03-25 VITALS — BP 126/76 | HR 81 | Temp 97.5°F | Ht 62.0 in | Wt 134.9 lb

## 2023-03-25 DIAGNOSIS — K602 Anal fissure, unspecified: Secondary | ICD-10-CM | POA: Insufficient documentation

## 2023-03-25 DIAGNOSIS — K601 Chronic anal fissure: Secondary | ICD-10-CM

## 2023-03-25 DIAGNOSIS — K5909 Other constipation: Secondary | ICD-10-CM

## 2023-03-25 MED ORDER — WHITE PETROLATUM 41 % TOPICAL OINTMENT
Freq: Two times a day (BID) | 2 refills | Status: DC
Start: 2023-03-25 — End: 2023-07-23
  Filled 2023-03-25: qty 60, 30d supply, fill #0

## 2023-03-25 NOTE — Progress Notes (Signed)
Chief Complaint: Rectal Prolapse         History of Present Illness  (taken from office visit in 2022)   [name] is a 22 y.o. female presents for evaluation of rectal prolapse.     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 She presented to the hospital in May with stroke-like symptoms. She was told that she did not have a stroke but should be seen by cardiology ASAP. At her visit with them, she reported DOE and dizzy spells. She also reported rectal prolapse and was referred to CRS. She was to complete a CTA and see neurology. She lives 3 hours away and has not completed them yet.     She and her mother report a long standing hx of chronic constipation since childhood. She's tried multiple OTC medications including laxatives, fiber, and stool softeners. She has seen GI but it's been several years. She states that she had a balloon test but no imaging or colonoscopy. She's not currently taking any bowel regimen. She has a BM once every few weeks. These are firm and small. She sits on the toilet for 30 mins and endorse straining. She has tissue protruding from her anus with BMs and heavy lifting. She has to manually reduce this 90% of the time. She does endorse recent bleeding from the area but no hematochezia, melena, or blood dripping into the toilet. She denies any FI of stool or gas. She drinks 64 oz of water daily and is active. She does have 2 children via vaginal birth. Her protrusion has gotten worse since her second child. She denies any abdominal, n/v, fevers, or chills but does endorse pain with BM since childhood.      Major medical problems: ADHD, angina, constipation, fallot tetralogy, GERD, HA, seizure, thalassemia trait     Subjective:    03/25/23:  Patient returns for follow up with cc of progressive worsening of symptoms over the past 31mo. She states that following her last appointment she trialed Miralax but it did not help her  symptoms so she discontinued therapy. She has not trialed any additional medications. She is not currrently taking any medications. She describes BM 1-2xwk which are firm and hard. She describes significant burning pain during BM. She states that the pain is severe and sharp and localized to the skin adjacent to the anus. She has had two children since last followup both born without complications but she notes that the obgyn doctors noted prolapse during her second child birth and recommended she follow up for care.     Past History       Current Outpatient Medications   Medication Sig    aspirin (ECOTRIN) 81 mg Oral Tablet, Delayed Release (E.C.) Take 81 mg by mouth Once a day (Patient not taking: Reported on 11/27/2020)    docusate sodium (COLACE) 100 mg Oral Capsule Take 1 Cap (100 mg total) by mouth Twice daily (Patient not taking: Reported on 08/16/2017)    hydrocortisone 2.5 % Apply externally cream with perineal applicator by Rectal route Twice daily    hydrocortisone acetate (ANUSOL-HC) 25 mg Rectal Suppository 1 Suppository (25 mg total) by Rectal route Twice per day as needed For internal hemorrhoids (Patient not taking: Reported on 08/16/2017)    hydrOXYzine HCl (ATARAX) 25 mg Oral Tablet Take 1 Tab (25 mg total) by mouth Three times a day as needed for Itching (Patient not taking: Reported on 11/27/2020)  Ibuprofen (MOTRIN) 600 mg Oral Tablet Take 1 Tab (600 mg total) by mouth Four times a day as needed for Pain    prenatal vitamin-iron-folate Tablet Take 1 Tab by mouth Once a day (Patient not taking: Reported on 11/27/2020)    raNITIdine (ZANTAC) 150 mg Oral Tablet Take 1 Tab (150 mg total) by mouth Twice daily For acid reflux (Patient not taking: Reported on 11/27/2020)    valACYclovir (VALTREX) 1 gram Oral Tablet        Allergies   No Known Allergies          Past Medical History:   Diagnosis Date    Attention deficit disorder 08/26/2011    Chest pain       Has had negative nonnuclear stress tests in  October of 2008 and March 2001. Last Holter February 13 demonstrated 13 PVC's with one couplets and normal low and high rates.     Constipation       Has been seen multiple times by GI for this.     Fallot tetralogy      Fever of unknown origin 08/30/2007    GERD (gastroesophageal reflux disease)      Headache      Heart disease      Impaired hearing       Past history of secretory otitis.     Otitis media      Piercing      Psychiatric problem      Rash       Eczema in scalp    Seizure (CMS HCC)       Treated in the past for seizures and followed by a neurologist.    STD (sexually transmitted disease) 07/2015     chlamydia    Thalassemia trait      Unspecified deficiency anemia       Hemoglobin electrophoresis has shown 87% A, 3.5% A2 and 9% fetal in past.  Patient eats one meal a day. She eats ice.                   Past Surgical History:   Procedure Laterality Date    HX HEART SURGERY        PB INJECT W CARD CATH LV/LA ANGIO        PB REPLACEMENT, PULMONARY VALVE        PB REPR ASD W BYPASS        PB REPR TET FALLOT W PULM ATRESIA          Family History  Family Medical History:            Problem Relation (Age of Onset)     ADHD/ADD Other     Congestive Heart Failure Other     Multiple Sclerosis Other     SLE Other     Seizures Brother     Unspecified Intellectual Disability Other                Social History  Social history on file   Social History            Socioeconomic History    Marital status: Single    Years of education: 9   Occupational History    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  Examination  Vitals: BP 126/76   Pulse 81   Temp 36.4 C (97.5 F) (Thermal Scan)   Ht 1.575 m (5\' 2" )   Wt 61.2 kg (134 lb 14.7 oz)   SpO2 98% Comment: Ra  BMI 24.68 kg/m   General: appears in good health  Eyes: Conjunctiva clear.  HENT: No obvious deformity  Neck: no adenopathy noted on visual inspection  GI: Posterior midline fissure noted on external exam; no obvious prolapse noted at time of exam; DRE deferred given fissure  Psychiatric: normal affect, behavior, memory, thought content, judgement, and speech.  Diagnosis and Plan  Fissure  - nifedipine + lidocaine gel TID 8 weeks  - Telephone f/u in 8 weeks to assess symptomatic improvement  2. Constipation  Previous small bowel follow through negative; will proceed with remainder of workup at this time  -25g fiber/day   -GI referral   -MRI defecography  -gastric emptying study  -sitz marker test    Addendum:    I saw and examined this patient with the resident/APP above.  Please see the resident/APP's note, which I have carefully reviewed, for full details. I agree with the findings as documented and plan of care as documented in the resident/APP's note.  I performed the medical decision making in its entirety. Any additions/exceptions are edited/noted.     Multiple issues currently:  For anal fissure:  Etiology and treatment of anal fissures was discussed in detail:  Nonoperative treatment of acute fissures will have a response in approximately 50% of patients with sitz baths and fiber (grade 1B)  For chronic anal fissures - topical CCB have been associated with healing rates of 65-95% and is better tolerated than topical nitrates (grade 1A)  Botox - associated with healing rates of 37-43% and increased resolution of anal pain/discomfort compared to topical therapies (grade 1C)  Lateral internal sphincterotomy is associated with healing rates of 88-100%, however  carries an incontinence risk of 8-30% (grade 1A)  LIS should not be offered as first line therapy for certain patients - IBD, women with prior obstetrical injuries, previous anorectal operations or a documented sphincter injury  Repeat LIS can be offered with low risk of fecal incontinence - 98% healing rate with approx 4-5% incontinence rate (grade 2C)    At this time will proceed with topical treatment with plans for follow up in 8 weeks for re-evaluation    Chronic constipation  - when I met her in 2022 we discussed the possible etiologies of constipation and I had ordered gastric emptying, small bowel follow through, sitz marker and MRI defecography as well as placed referral to GI. She then moved to [place] shortly after and only completed her SBFT (normal). She has not trialed any medication other than Miralax and continues to struggle with constipation. Discussed need for further information so tests above reodered and referred to GI. I discussed that until she has exhausted medical therapy I would  not offer any surgery. Would recommend resuming Miralax and starting fiber.     3.    Rectal prolapse vs. Hemorrhoids   -  previously examination showed hemorrhoids without evidence of prolapse. Full examination deferred today given fissure however regardless, given her issues with chronic constipation I still would not offer surgery at this time given high risk of recurrence with constipation issue.       Will re-evaluate in 8 weeks.    On the day of the encounter, a total of 40 minutes was spent on this patient encounter including review of historical information, examination, documentation and post-visit activities, >50% of which was spent directly counseling the patient regarding treatment options and coordinating care.       Jake Samples, MD 03/25/2023 21:22  Colorectal Surgery

## 2023-03-25 NOTE — Progress Notes (Deleted)
[name]  Date of Service: 03/25/2023    Chief Complaint: Rectal Prolapse       History of Present Illness  (taken from office visit in 2022)   [name] is a 22 y.o. female presents for evaluation of rectal prolapse.     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 She presented to the hospital in May with stroke-like symptoms. She was told that she did not have a stroke but should be seen by cardiology ASAP. At her visit with them, she reported DOE and dizzy spells. She also reported rectal prolapse and was referred to CRS. She was to complete a CTA and see neurology. She lives [place] away and has not completed them yet.     She and her mother report a long standing hx of chronic constipation since childhood. She's tried multiple OTC medications including laxatives, fiber, and stool softeners. She has seen GI but it's been several years. She states that she had a balloon test but no imaging or colonoscopy. She's not currently taking any bowel regimen. She has a BM once every few weeks. These are firm and small. She sits on the toilet for 30 mins and endorse straining. She has tissue protruding from her anus with BMs and heavy lifting. She has to manually reduce this 90% of the time. She does endorse recent bleeding from the area but no hematochezia, melena, or blood dripping into the toilet. She denies any FI of stool or gas. She drinks 64 oz of water daily and is active. She does have 2 children via vaginal birth. Her protrusion has gotten worse since her second child. She denies any abdominal, n/v, fevers, or chills but does endorse pain with BM since childhood.      Major medical problems: ADHD, angina, constipation, fallot tetralogy, GERD, HA, seizure, thalassemia trait    Subjective:        Past History  Current Outpatient Medications   Medication Sig    aspirin (ECOTRIN) 81 mg Oral Tablet, Delayed Release (E.C.) Take 81 mg by  mouth Once a day (Patient not taking: Reported on 11/27/2020)    docusate sodium (COLACE) 100 mg Oral Capsule Take 1 Cap (100 mg total) by mouth Twice daily (Patient not taking: Reported on 08/16/2017)    hydrocortisone 2.5 % Apply externally cream with perineal applicator by Rectal route Twice daily    hydrocortisone acetate (ANUSOL-HC) 25 mg Rectal Suppository 1 Suppository (25 mg total) by Rectal route Twice per day as needed For internal hemorrhoids (Patient not taking: Reported on 08/16/2017)    hydrOXYzine HCl (ATARAX) 25 mg Oral Tablet Take 1 Tab (25 mg total) by mouth Three times a day as needed for Itching (Patient not taking: Reported on 11/27/2020)    Ibuprofen (MOTRIN) 600 mg Oral Tablet Take 1 Tab (600 mg total) by mouth Four times a day as needed for Pain    prenatal vitamin-iron-folate Tablet Take 1 Tab by mouth Once a day (Patient not taking: Reported on 11/27/2020)    raNITIdine (ZANTAC) 150 mg Oral Tablet Take 1 Tab (150 mg total) by mouth Twice daily For acid reflux (Patient not taking: Reported on 11/27/2020)    valACYclovir (VALTREX) 1 gram Oral Tablet      No Known Allergies  Past Medical History:   Diagnosis Date    Attention deficit disorder 08/26/2011    Chest pain     Has had negative nonnuclear stress tests  in October of 2008 and March 2001. Last Holter February 13 demonstrated 13 PVC's with one couplets and normal low and high rates.     Constipation     Has been seen multiple times by GI for this.     Fallot tetralogy     Fever of unknown origin 08/30/2007    GERD (gastroesophageal reflux disease)     Headache     Heart disease     Impaired hearing     Past history of secretory otitis.     Otitis media     Piercing     Psychiatric problem     Rash     Eczema in scalp    Seizure (CMS HCC)     Treated in the past for seizures and followed by a neurologist.    STD (sexually transmitted disease) 07/2015    chlamydia    Thalassemia trait     Unspecified deficiency anemia     Hemoglobin electrophoresis  has shown 87% A, 3.5% A2 and 9% fetal in past.  Patient eats one meal a day. She eats ice.          Past Surgical History:   Procedure Laterality Date    HX HEART SURGERY      PB INJECT W CARD CATH LV/LA ANGIO      PB REPLACEMENT, PULMONARY VALVE      PB REPR ASD W BYPASS      PB REPR TET FALLOT W PULM ATRESIA       Family History  Family Medical History:       Problem Relation (Age of Onset)    ADHD/ADD Other    Congestive Heart Failure Other    Multiple Sclerosis Other    SLE Other    Seizures Brother    Unspecified Intellectual Disability Other          Social History  Social History     Socioeconomic History    Marital status: Single    Years of education: 9   Occupational History    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, repeating   Tobacco Use    Smoking status: Every Day     Types: Cigarettes    Smokeless tobacco: Never    Tobacco comments:     Former smoker - stopped when pregnant    Substance and Sexual Activity    Alcohol use: No     Alcohol/week: 0.0 standard drinks of alcohol    Drug use: No    Sexual activity: Yes     Partners: Male     Birth control/protection: None   Other Topics Concern    Right hand dominant Yes     Review of Systems  {Ros - complete:30496}  Examination  Vitals: {EXAMWVU VITALS :20943}  General: {GENERAL :20944}  Eyes: {Eye:20945}  HENT:{HENT:20620}  Neck: {Neck:20621}  Cardiovascular: {Cardiovascular:20622}  Lungs: {Lung:20948}  Abdomen: {Gastrointestinal:20954}  Genito-urinary: {MF-DRE:20714}  Extremities: {Extremities:20958}  Skin: {Skin:20619}  Neurologic: {Neurologic:20623}  Lymphatics: {Lymphatics:20961}  Psychiatric: {Psychiatric:20962}  Diagnosis and Plan

## 2023-03-30 ENCOUNTER — Ambulatory Visit
Admission: RE | Admit: 2023-03-30 | Discharge: 2023-03-30 | Disposition: A | Discharge status: Home / Self Care | Payer: Medicaid Other | Source: Ambulatory Visit | Attending: GENERAL SURGERY | Admitting: GENERAL SURGERY

## 2023-03-30 ENCOUNTER — Other Ambulatory Visit: Payer: Self-pay

## 2023-03-30 DIAGNOSIS — K5909 Other constipation: Secondary | ICD-10-CM | POA: Insufficient documentation

## 2023-04-01 ENCOUNTER — Other Ambulatory Visit: Payer: Self-pay

## 2023-04-01 ENCOUNTER — Ambulatory Visit
Admission: RE | Admit: 2023-04-01 | Discharge: 2023-04-01 | Disposition: A | Discharge status: Home / Self Care | Payer: Medicaid Other | Source: Ambulatory Visit | Attending: GENERAL SURGERY | Admitting: GENERAL SURGERY

## 2023-04-01 DIAGNOSIS — K5909 Other constipation: Secondary | ICD-10-CM | POA: Insufficient documentation

## 2023-04-04 ENCOUNTER — Ambulatory Visit
Admission: RE | Admit: 2023-04-04 | Discharge: 2023-04-04 | Disposition: A | Discharge status: Home / Self Care | Payer: Medicaid Other | Source: Ambulatory Visit | Attending: GENERAL SURGERY | Admitting: GENERAL SURGERY

## 2023-04-04 ENCOUNTER — Encounter (HOSPITAL_COMMUNITY): Payer: Self-pay | Admitting: GENERAL SURGERY

## 2023-04-04 ENCOUNTER — Other Ambulatory Visit: Payer: Self-pay

## 2023-04-04 DIAGNOSIS — K5909 Other constipation: Secondary | ICD-10-CM | POA: Insufficient documentation

## 2023-04-05 ENCOUNTER — Other Ambulatory Visit (INDEPENDENT_AMBULATORY_CARE_PROVIDER_SITE_OTHER): Payer: Self-pay

## 2023-04-05 ENCOUNTER — Encounter (INDEPENDENT_AMBULATORY_CARE_PROVIDER_SITE_OTHER): Payer: Self-pay

## 2023-04-05 ENCOUNTER — Ambulatory Visit
Admission: RE | Admit: 2023-04-05 | Discharge: 2023-04-05 | Disposition: A | Discharge status: Home / Self Care | Payer: Medicaid Other | Source: Ambulatory Visit | Attending: PEDIATRICS-PEDIATRIC CARDIOLOGY | Admitting: PEDIATRICS-PEDIATRIC CARDIOLOGY

## 2023-04-05 DIAGNOSIS — Z952 Presence of prosthetic heart valve: Secondary | ICD-10-CM

## 2023-04-05 DIAGNOSIS — Q213 Tetralogy of Fallot: Secondary | ICD-10-CM

## 2023-04-06 ENCOUNTER — Encounter (INDEPENDENT_AMBULATORY_CARE_PROVIDER_SITE_OTHER): Payer: Self-pay

## 2023-04-06 ENCOUNTER — Encounter (INDEPENDENT_AMBULATORY_CARE_PROVIDER_SITE_OTHER): Payer: Self-pay | Admitting: GENERAL SURGERY

## 2023-04-07 ENCOUNTER — Ambulatory Visit (INDEPENDENT_AMBULATORY_CARE_PROVIDER_SITE_OTHER): Payer: Self-pay

## 2023-04-07 NOTE — Telephone Encounter (Addendum)
Returned call to Najiyah's mother today.  Previous documentation available via MyChart message from 04/05/2023 where initial request came in.    Cladie had recent local hospital abdominal xray for c/o constipation, 9/22 @ [facility].  XR read with comment: "The lowermost wire is broken with stable appearance. Stable surgical clip noted at the medial right lung base."     On my review, last chest xray from 2018 showed 1 wire with broken appearance.  However, with new c/o chest pain, chest xray ordered on 9/23 and completed on 9/23.  Result WNL and stable findings.    I communicated this result to Lailie's mother today.  After comprehensive review with Dr. Georga Bora, we have scheduled Maragret on 04/16/23 at 1pm, following GI studies, for review of her chest pain and updated testing.  I have advised to record symptoms and bring to this visit.  Family verbalized an understanding.    Penne Lash, RN 04/07/2023  09:55        ----- Message from Worthington S sent at 04/06/2023  9:45 AM EDT -----  Regarding: FW: New Appointment  ----- Message from Fara Boros sent at 04/05/2023  9:36 AM EDT -----  Copied From CRM [phone] Nachreiner called to schedule an appointment.   Mom calling in and was advised after pt had xray  to follow up due to loose wires and some chest pain.  Spoke with Hassell Patras about this.  Pt does have a follow up appt in January, however mom would like pt seen sooner.  Mom would prefer pt to be seen 9/30 if possible,  if not then 10/18 or 10/25.  Please call her [phone].  Thank you

## 2023-04-12 NOTE — Progress Notes (Unsigned)
Dear Dr. Randon Goldsmith,    I had the pleasure of evaluating your patient, [name] in the Pediatric and Adult Congenital Cardiology clinic at [facility] in [place], [place] on  04/16/2023. 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 She is accompanied by her  mother and the history was obtained from [name] and her mother.    [name] was noted to have an episode of cyanosis at 9 months and was seen by Dr. Dolphus Jenny who placed a heart stent at nine months of age. 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 She underwent complete correction of the defect on July 27, 2000, with Dacron patch closure of a large malalignment ventricular septal defect,   infundibular muscle resection and transannular right ventricular outflow tract pericardial patch, as well as primary suture closure of a moderate-sized secundum atrial septal defect.   She later underwent pulmonary valve replacement with a #27 Carpentier-Edwards pericardial bioprosthetic valve and bovine pericardial patch angioplasty of the main pulmonary artery on August 18, 2007. On 02/01/2014 she underwent excision of the old #27 Carpentier-Edwards bioprosthetic pulmonary valve, pulmonary valve replacement with a #29 Medtronic Mosaic porcine bioprosthetic pulmonary valve, bovine pericardial patch angioplasty of the main pulmonary artery at the pulmonary valve insertion site.    [name] was also told that she would be unable to have children, however, she now has two children. The only issue during delivery was  low blood pressure. Both deliveries were vaginal and Induced.    During the last trimester of her pregnancy with her second child, she required constant monitoring. Her children are both from different fathers and they are semi-involved in their care.    In 2022, she went to the hospital  as the whole left side of her body was feeling heavy and limp. She called an ambulance  immediately after calling her mother, the EMT's noted signs of a stroke. She was taken to [facility] where they reported that she did not have a stroke but said that she should be seen by a cardiologist as soon as possible. She did not have any similar issues prior to this episode. It took her around 3 hours to return to her normal self. for that she was seen and cleared by neurology who referred that to the migraine as per the patient  ( reports not available to me )     [name] had her first migraine 8-9 years ago. They are always focused on the left side of her head and the left side of her neck. She generally requires the lights to be off and minimal noise to get over these headaches.    She has occasional chest pains which do not bother her but has otherwise been doing well from a cardiovascular standpoint.  She underwent a chest X ray on 04/05/2023 which showed that one of her sternal wires was possibly broken. She has no clinical concerns including pain or irritation at the site though.     Past Medical /Surgical History:      Past Medical History:   Diagnosis Date    Attention deficit disorder 08/26/2011    Chest pain     Has had negative nonnuclear stress tests in October of 2008 and March 2001. Last Holter February 13 demonstrated 13 PVC's with one couplets and normal low and high rates.     Constipation     Has been seen multiple times by GI for  this.     Fallot tetralogy     Fever of unknown origin 08/30/2007    GERD (gastroesophageal reflux disease)     Headache     Heart disease     Impaired hearing     Past history of secretory otitis.     Otitis media     Piercing     Psychiatric problem     Rash     Eczema in scalp    Seizure (CMS HCC)     Treated in the past for seizures and followed by a neurologist.    STD (sexually transmitted disease) 07/2015    chlamydia    Thalassemia trait     Unspecified deficiency anemia     Hemoglobin electrophoresis has shown 87% A, 3.5% A2 and 9% fetal in past.  Patient eats one  meal a day. She eats ice.          Past Surgical History:   Procedure Laterality Date    HX HEART SURGERY      PB INJECT W CARD CATH LV/LA ANGIO      PB REPLACEMENT, PULMONARY VALVE      PB REPR ASD W BYPASS      PB REPR TET FALLOT W PULM ATRESIA         Past Medical History: History of beta thalassemia. Follows up with hematology.  History of asthma in the past. History of Migraine headaches. History of hemorrhoids (not currently active)  Past Surgical History: Non non cardiac surgeries.  Social History: Lives with mother  and two healthy children. She is currently not working.  Family History: There is no family history of structural heart disease, arrhythmias, cardiomyopathy, pacemaker or ICD implantation at a young age, heart failure, cardiomyopathy, sudden death, drowning, motor vehicle accident, congenital deafness.      Migraine headaches: Mother, Asthma  She has 4 healthy siblings.  Brother has seizures.      Medications    Current Outpatient Medications   Medication Instructions    aspirin (ECOTRIN) 81 mg, DAILY    docusate sodium (COLACE) 100 mg, Oral, 2 TIMES DAILY    hydrocortisone 2.5 % Apply externally cream with perineal applicator Rectal, 2 TIMES DAILY    hydrocortisone acetate (ANUSOL-HC) 25 mg, Rectal, 2 TIMES DAILY PRN, For internal hemorrhoids    hydrOXYzine HCL (ATARAX) 25 mg, Oral, 3 TIMES DAILY PRN    Ibuprofen (MOTRIN) 600 mg, Oral, 4 TIMES DAILY PRN    Nifed 0.5%/Lido 1% Oint COMPOUND Topical, 2 TIMES DAILY    prenatal vitamin-iron-folate Tablet 1 Tablet, DAILY    raNITIdine (ZANTAC) 150 mg, Oral, 2 TIMES DAILY, For acid reflux    valACYclovir (VALTREX) 1 gram Oral Tablet No dose, route, or frequency recorded.        Allergies    No Known Allergies     Review of Systems:    Constitutional: Negative  Eyes: No visual changes  Ear, Nose, Throat: No complaints.  Respiratory: No shortness or breath  Cardiovascular: As documented in the history of present illness   Gastrointestinal: Good appetite,  no nausea, diarrhea or vomiting  Genitourinary: No hematuria.   Immunologic: No history of recurrent infections.  Dermatologic: Intact skin, no new onset rash  Neurologic: Denies numbness, tingling or tremors  Musculoskeletal: No bone or joint pain    OBJECTIVE  BP 119/73   Pulse 94   Temp 36.2 C (97.2 F) (Thermal Scan)   Ht 1.558 m (5' 1.34")   Wt  62.3 kg (137 lb 5.6 oz)   SpO2 98%   BMI 25.67 kg/m     GENERAL: Well appearing, well hydrated, acyanotic.  No acute distress.  HEENT: Normocephalic. Atraumatic. Extra ocular movements intact. Mucous membranes moist and pink.  NECK: Neck supple.  No Jugular venous distension.  CARDIOVASCULAR: Regular rate and rhythm.  No  rubs, gallops, or clicks.  Normal S1 and physiologically split S2. Peripheral pulses well palpable. No radiofemoral delay. A grade 2/6 ejections systolic murmur was heard at the left upper sternal border.  LUNGS: Easy respiratory effort.  Breath sounds clear bilaterally without wheezes, rales, or rhonchi.  ABDOMEN: Soft. No hepatosplenomegaly.   EXTREMITIES: No cyanosis, clubbing, or edema.    SKIN: Pink without cyanosis.  NEURO: Normal strength and tone for age.      DIAGNOSTIC TESTS  ECG (04/16/2023): I reviwed an electrocardiogram performed in the clinic on 04/16/2023 which showed normal sinus rhythm with a ventricular rate of 85 beats per minute. The corrected QT interval measured 473 msec. QRS duration measured 140 msec with RBBB.       ECHO (04/16/2023): Independently reviewed.  1. There is a bioprosthetic pulmonary valve (#29 porcine) with thickened leaflets that are functioning.  2. Peak gradient across the pulmonary valve measures 24 mm Hg. Mean gradient 13 mmHg.There is no significant pulmonary insufficiency.  3. RV pressure by TR jet measures 27 mm Hg plus mean right atrial pressure.  4. Mild RV dilation. The right ventricular systolic function is low normal.  5. Normal LV size and function.    PRIOR INVESTIGATIONS    ECHO 07/27/2022  Pt  with TOF with absent pulmonary valve syndrome by hx.  No residual intracardiac shunt.  Good s function of both ventricles.  Only mild RV dilatation. The IVC is dilated and totally collapsible   There is a bioprosthetic pulmonary valve (#29 porcine) with thickened leaflets that are functioning well. Mild gradient  across the valve, no significant regurgitation. . PS gradient is 20 mm hg peak and 12 mm Hg mean.  She has a retroaortic innominate vein   Dilated PAS     MRI/MRA:  CTA 09/23/21:   Impression:     1.  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 She underwent complete correction of the defect on July 27, 2000, with Dacron patch closure of a large malalignment ventricular septal defect,   infundibular muscle resection and transannular right ventricular outflow tract pericardial patch, as well as primary suture closure of a moderate-sized secundum atrial septal defect.   She later underwent pulmonary valve replacement with a #27 Carpentier-Edwards pericardial bioprosthetic valve and bovine pericardial patch angioplasty of the main pulmonary artery on August 18, 2007.  On 02/01/2014 she underwent excision of the old #27 Carpentier-Edwards bioprosthetic pulmonary valve, pulmonary valve replacement with a #29 Medtronic Mosaic porcine bioprosthetic pulmonary valve, bovine pericardial patch angioplasty of the main pulmonary artery at the pulmonary valve   insertion site     2. Qualitatively mildly dilated  right ventricular size.     3. The prosthetic pulmonary valve is well seated. Short dilated main pulmonary artery with contiguous branch pulmonary arteries.  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 z score 6.4   The LPA is short and moderately  dilated. z score 4  The branch pulmonary arteries are widely patent without focal areas of narrowing or stenosis.     4. 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  Holter 09/23/21:   Indication: ToF, palpitations, Beta-Thalassemia,  Non rheumatic pulmonary valve insufficincy     Patient had a min HR of 61 bpm, max HR of 175 bpm, and avg HR of 97 bpm. Predominant underlying rhythm was Sinus Rhythm. Bundle Branch Block/IVCD was present. Isolated SVEs were rare (<1.0%), and no SVE Couplets or SVE Triplets were present. Isolated VEs were rare (<1.0%), VE Couplets were rare (<1.0%), and no VE Triplets were present. Ventricular Trigeminy was present.  CPT:  Cath:    ACHD AP classification:        Patient is class II stage A-B      Assessment and Plan:      [name] 22 year-old female who  has Hx of TOF & absent pulmonary valve syndrome  noted at birth. She underwent complete correction of the defect on July 27, 2000, with Dacron patch closure of a large malalignment ventricular septal defect, infundibular muscle resection and transannular right ventricular outflow tract pericardial patch, as well as primary suture closure of a moderate-sized secundum atrial septal defect.     She later underwent pulmonary valve replacement with a #27 Carpentier-Edwards pericardial bioprosthetic valve and bovine pericardial patch angioplasty of the main pulmonary artery on August 18, 2007.  On 02/01/2014 she underwent excision of the old #27 Carpentier-Edwards bioprosthetic pulmonary valve, pulmonary valve replacement with a #29 Medtronic Mosaic porcine bioprosthetic pulmonary valve, bovine pericardial patch angioplasty of the main pulmonary artery at the pulmonary valve insertion site.     She has had a history of non cardiac chest pains but has otherwise been doing well from a cardiovascular standpoint.  Her clinical examination today revealed findings which are consistent with her diagnosis.  I obtained an electrocardiogram today which showed normal sinus rhythm.  I also obtained an echocardiogram today which showed mild prosthetic pulmonary valve stenosis without any significant pulmonary valve insufficiency.  Her RV was mildly dilated with  reasonable function.  Her LV size and function appeared normal.  Her RV pressure was within the normal range..    Given these findings, she does not require any sort of activity restriction.  As per the current American Heart Association guidelines, she is a candidate for endocarditis prophylaxis.    She is currently not taking her aspirin which I have requested her to resume.    FINAL DIAGNOSIS  ToF s/p pulmonary valve replacement     PLAN:      F/U in 12 months  Medications: Aspirin (restart)  No Activity Restrictions  She needs SBE Prophylaxis according to most recent AHA guidelines.   Does not need cardiac anesthesia for non cardiac procedures.    On the day of the encounter, a total of  60 minutes was spent on this patient encounter including review of historical information, examination, documentation and post-visit activities.  Onalee Hua, MD  Associate Professor of Pediatrics  Section of Pediatric Cardiology  [facility] Department of Pediatrics

## 2023-04-16 ENCOUNTER — Ambulatory Visit (HOSPITAL_COMMUNITY)
Admission: RE | Admit: 2023-04-16 | Discharge: 2023-04-16 | Disposition: A | Discharge status: Home / Self Care | Payer: Medicaid Other | Source: Ambulatory Visit

## 2023-04-16 ENCOUNTER — Other Ambulatory Visit: Payer: Self-pay

## 2023-04-16 ENCOUNTER — Ambulatory Visit (HOSPITAL_BASED_OUTPATIENT_CLINIC_OR_DEPARTMENT_OTHER)
Admission: RE | Admit: 2023-04-16 | Discharge: 2023-04-16 | Disposition: A | Discharge status: Home / Self Care | Payer: Medicaid Other | Source: Ambulatory Visit

## 2023-04-16 ENCOUNTER — Ambulatory Visit: Payer: 59 | Attending: PEDIATRICS-PEDIATRIC CARDIOLOGY | Admitting: PEDIATRICS-PEDIATRIC CARDIOLOGY

## 2023-04-16 VITALS — BP 119/73 | HR 94 | Temp 97.2°F | Ht 61.34 in | Wt 137.3 lb

## 2023-04-16 DIAGNOSIS — K5909 Other constipation: Secondary | ICD-10-CM

## 2023-04-16 DIAGNOSIS — Q213 Tetralogy of Fallot: Secondary | ICD-10-CM | POA: Insufficient documentation

## 2023-04-16 DIAGNOSIS — I371 Nonrheumatic pulmonary valve insufficiency: Secondary | ICD-10-CM

## 2023-04-16 DIAGNOSIS — Z952 Presence of prosthetic heart valve: Secondary | ICD-10-CM

## 2023-04-16 DIAGNOSIS — I517 Cardiomegaly: Secondary | ICD-10-CM

## 2023-04-16 DIAGNOSIS — Z7982 Long term (current) use of aspirin: Secondary | ICD-10-CM

## 2023-04-16 LAB — PEDIATRIC TRANSTHORACIC ECHO (IN CLINIC)
AV LVOT peak gradient: 2 mm[Hg]
Left Ventricular Outflow Tract Peak Velocity: 75.7 cm/s

## 2023-04-17 DIAGNOSIS — I4510 Unspecified right bundle-branch block: Secondary | ICD-10-CM

## 2023-04-17 LAB — ECG 12-LEAD - PEDS (FOR USE IN PED CARD CLINIC ONLY)
Atrial Rate: 85 {beats}/min
Calculated P Axis: 49 degrees
Calculated R Axis: -27 degrees
Calculated T Axis: 66 degrees
PR Interval: 186 ms
QRS Duration: 140 ms
QT Interval: 398 ms
QTC Calculation: 473 ms
Ventricular rate: 85 {beats}/min

## 2023-04-27 ENCOUNTER — Encounter (HOSPITAL_COMMUNITY): Payer: Self-pay

## 2023-05-05 DIAGNOSIS — I371 Nonrheumatic pulmonary valve insufficiency: Secondary | ICD-10-CM

## 2023-05-05 DIAGNOSIS — Q213 Tetralogy of Fallot: Secondary | ICD-10-CM

## 2023-05-05 DIAGNOSIS — Z952 Presence of prosthetic heart valve: Secondary | ICD-10-CM

## 2023-05-05 LAB — PEDS 14 DAY EXTENDED HOLTER MONITOR
Heart rate (average): 96 {beats}/min
Isolated SVE count: 84 episodes
Isolated VE Counts: 497 episodes

## 2023-05-20 ENCOUNTER — Ambulatory Visit (INDEPENDENT_AMBULATORY_CARE_PROVIDER_SITE_OTHER): Payer: Medicaid Other | Admitting: GENERAL SURGERY

## 2023-05-26 ENCOUNTER — Encounter (INDEPENDENT_AMBULATORY_CARE_PROVIDER_SITE_OTHER): Payer: Self-pay | Admitting: GENERAL SURGERY

## 2023-05-27 ENCOUNTER — Ambulatory Visit (INDEPENDENT_AMBULATORY_CARE_PROVIDER_SITE_OTHER): Payer: Medicaid Other | Admitting: GENERAL SURGERY

## 2023-05-31 ENCOUNTER — Ambulatory Visit (HOSPITAL_COMMUNITY): Payer: Medicaid Other

## 2023-06-03 ENCOUNTER — Ambulatory Visit (INDEPENDENT_AMBULATORY_CARE_PROVIDER_SITE_OTHER): Payer: Medicaid Other | Admitting: GENERAL SURGERY

## 2023-07-19 ENCOUNTER — Ambulatory Visit (INDEPENDENT_AMBULATORY_CARE_PROVIDER_SITE_OTHER): Payer: Self-pay | Admitting: PEDIATRICS-PEDIATRIC CARDIOLOGY

## 2023-07-23 ENCOUNTER — Ambulatory Visit (INDEPENDENT_AMBULATORY_CARE_PROVIDER_SITE_OTHER): Payer: 59 | Admitting: PEDIATRICS-PEDIATRIC CARDIOLOGY

## 2023-07-23 ENCOUNTER — Other Ambulatory Visit: Payer: Self-pay

## 2023-07-23 VITALS — BP 120/76 | HR 89 | Resp 18 | Ht 61.0 in | Wt 138.2 lb

## 2023-07-23 DIAGNOSIS — Z952 Presence of prosthetic heart valve: Secondary | ICD-10-CM

## 2023-07-23 DIAGNOSIS — Q213 Tetralogy of Fallot: Secondary | ICD-10-CM

## 2023-07-23 MED ORDER — ASPIRIN 81 MG CHEWABLE TABLET
81.0000 mg | CHEWABLE_TABLET | Freq: Every day | ORAL | Status: AC
Start: 2023-07-23 — End: ?

## 2023-07-23 NOTE — Progress Notes (Unsigned)
[facility]   Cardiac  Clinic Note  Pediatric Clinic         History of Present Illness:     I was asked to see this patient by Primary Care Provider [facility] to consult regarding cardiac issues.     {Person; guardian:61} here today and says***   Patient is a 23 y.o. y.o. female who presents for {EVALUATION/FOLLOW-UP:17928::"follow-up"} of a heart concern.    1.  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 She underwent complete correction of the defect on July 27, 2000, with Dacron patch closure of a large malalignment ventricular septal defect,   infundibular muscle resection and transannular right ventricular outflow tract pericardial patch, as well as primary suture closure of a moderate-sized secundum atrial septal defect.   She later underwent pulmonary valve replacement with a #27 Carpentier-Edwards pericardial bioprosthetic valve and bovine pericardial patch angioplasty of the main pulmonary artery on August 18, 2007.  On 02/01/2014 she underwent excision of the old #27 Carpentier-Edwards bioprosthetic pulmonary valve, pulmonary valve replacement with a #29 Medtronic Mosaic porcine bioprosthetic pulmonary valve, bovine pericardial patch angioplasty of the main pulmonary artery at the pulmonary valve   insertion site    The murmur was first heard at birth    Symptoms:  Cyanosis: {Desc; cyanosis:13390}  Diaphoresis: {Response; yes/no/na:63::"no"}  Respiratory Distress: {Respiratory distress:13392::"none"}  Exercise Intolerance: {exercise intolerance:17479::"none"}  Chest Pain: {Desc; chest pain:5016::"none"}  Palpitations: {Symptoms; palpitations:13394::"none"}  Syncope: {Desc; syncope w/ none:17483::"none"}  Edema: {Response; yes/no/na:63::"no"}  Feeding Intolerance: {Response; yes/no/na:63::"no"}  Poor Growth: {Response; yes/no/na:63::"no"}      Last Echocardiogram -     I have reviewed past medical family and social history with the patient or  family.      Past Medical History:   ***No Recent Hospitalizations  ***No Recent Operations    Family and Social History:   ***No family history of congenital heart disease   I have reviewed and updated as appropriate the past medical, family and social history.               Review of Systems:   ***A comprehensive Review of Systems was performed is negative other than noted in the HPI    ***CV and Pulm ROS  are neg  No DOE, sob, cyanosis, edema, cough, wheeze, syncope, chest pain, palpitations          Medications:   I have reviewed this patient's current medications    No outpatient medications have been marked as taking for the 07/23/23 encounter (Office Visit) with Lilyan Punt, MD.         Physical Exam:     Blood pressure 120/76, pulse 89, resp. rate 18, height 1.549 m (5\' 1" ), weight 62.7 kg (138 lb 3.2 oz), SpO2 100%, unknown if currently breastfeeding.  Body mass index is 26.11 kg/m.  Facility age limit for growth %iles is 20 years.  Height: 154.9 cm (5\' 1" )  Weight: 62.7 kg (138 lb 3.2 oz)  Growth %ile SmartLinks can only be used for patients less than 51 years old.    General - NAD, awake, alert   HEENT - NC/AT EOMI   Cardiac - RRR nl S1 and S2  Gr II SEM lateral to mid to LLSB radiates *** No diastolic murmur No click, thrill or heave   Respiratory - Lungs clear   Abdominal - Liver at [facility]   Extremity  Normal range of motion. Nl pulses in brachial  and femoral areas, No Clubbing, Edema, Cyanosis   Skin - No rash   Neuro - Nl ***gait, posture, tone         Labs     EKG results:         EKG with today's visit  ***      Echocardiography today:             Assessment and Plan:     [name] is a 23 y.o. female with   Patient Active Problem List   Diagnosis    GERD    Tetralogy of Fallot    Pulmonic valve insufficiency    Status post pulmonary valve replacement    High risk social situation    Observed seizure-like activity (CMS HCC)    Attention deficit disorder (ADD)    Family history of asthma    Unspecified  deficiency anemia    Impaired hearing    Seizure (CMS HCC)    Chest pain    Constipation    Spell of transient neurologic symptoms    History of posttraumatic stress disorder (PTSD)    Beta thalassemia (CMS HCC)    Syncopal episodes    Vitamin D deficiency    Beta thalassemia trait    Menorrhagia    Pregnancy    Alpha thalassemia minor trait            IMP: ***  {:18092}  DX:   ***Condition is not improving    PLAN:  F/U in *** months/years with ***  No Activity Restrictions  *** No need for SBE Prophylaxis  ***Medication         Attending Attestation:     ***xray images were were directly visualized and were interpreted and reviewed by me.   Major findings were ***.  ***Outside medical records from ***site, from timeframe *** were reviewed by me and are summarized as follows***.  *** Echocardiographic images were directly visualized and were interpreted and reviewed and by me.  ***I spent great than 50% of a *** minute visit counselling regarding the impact and prognosis of the cardiac concern.  New Patient  I spent at least 11 minutes of a 20 minute visit in counseling regarding the cardiac concern ([id number]).  I spent at least 16 minutes of a 30 minute visit in counseling regarding the cardiac concern ([id number]).  I spent at least 23 minutes of a 45 minute visit in counseling regarding the cardiac concern (13086).  I spent at least 31 minutes of a 60 minute visit in counseling regarding the cardiac concern (57846).    Established Patient  I spent at least 8 minutes of a 15 minute visit in counseling regarding the cardiac concern ([id number]).  I spent at least 13 minutes of a 25 minute visit in counseling regarding the cardiac concern ([id number]).  I spent at least 21 minutes of a visit in counseling regarding the cardiac concern (24401).      Sincerely,  Park Breed , MD10:25  Park Breed , MD   Pediatric Cardiology  Professor of Pediatrics    CC:   Patient Care Team:  Association, [facility] as PCP - General  (MULTISPECIALTY CLINIC OR GROUP PRACTICE)  Ortencia Kick, MD as PCP - Managed Care/Insurance  Frances Furbish    Copy to patient  Carlile,[name]   [address]  [place] [address]

## 2023-07-24 ENCOUNTER — Encounter (INDEPENDENT_AMBULATORY_CARE_PROVIDER_SITE_OTHER): Payer: Self-pay | Admitting: PEDIATRICS-PEDIATRIC CARDIOLOGY

## 2023-08-08 DIAGNOSIS — I4510 Unspecified right bundle-branch block: Secondary | ICD-10-CM

## 2023-08-08 LAB — ECG 12 LEAD - (AMB USE ONLY)(MUSE, IN CLINIC)
Atrial Rate: 85 {beats}/min
Calculated P Axis: 44 degrees
Calculated R Axis: -42 degrees
Calculated T Axis: 60 degrees
PR Interval: 168 ms
QRS Duration: 148 ms
QT Interval: 412 ms
QTC Calculation: 490 ms
Ventricular rate: 85 {beats}/min

## 2023-08-25 ENCOUNTER — Encounter (HOSPITAL_COMMUNITY): Payer: Self-pay

## 2023-08-25 ENCOUNTER — Emergency Department (HOSPITAL_COMMUNITY): Payer: Medicaid Other

## 2023-08-25 ENCOUNTER — Emergency Department
Admission: EM | Admit: 2023-08-25 | Discharge: 2023-08-25 | Disposition: A | Discharge status: Home / Self Care | Payer: Medicaid Other | Attending: Family Medicine | Admitting: Family Medicine

## 2023-08-25 ENCOUNTER — Other Ambulatory Visit: Payer: Self-pay

## 2023-08-25 DIAGNOSIS — R079 Chest pain, unspecified: Secondary | ICD-10-CM

## 2023-08-25 DIAGNOSIS — K2900 Acute gastritis without bleeding: Secondary | ICD-10-CM | POA: Insufficient documentation

## 2023-08-25 DIAGNOSIS — Z1152 Encounter for screening for COVID-19: Secondary | ICD-10-CM | POA: Insufficient documentation

## 2023-08-25 DIAGNOSIS — R0789 Other chest pain: Secondary | ICD-10-CM | POA: Insufficient documentation

## 2023-08-25 LAB — ECG 12-LEAD
Atrial Rate: 96 {beats}/min
Calculated P Axis: 65 degrees
Calculated R Axis: -42 degrees
Calculated T Axis: 56 degrees
PR Interval: 136 ms
QRS Duration: 136 ms
QT Interval: 376 ms
QTC Calculation: 475 ms
Ventricular rate: 96 {beats}/min

## 2023-08-25 LAB — LIPASE: LIPASE: 26 U/L (ref 10–60)

## 2023-08-25 LAB — COMPREHENSIVE METABOLIC PANEL, NON-FASTING
ALBUMIN: 3.9 g/dL (ref 3.5–5.0)
ALKALINE PHOSPHATASE: 76 U/L (ref 40–110)
ALT (SGPT): 14 U/L (ref <31)
ANION GAP: 9 mmol/L (ref 4–13)
AST (SGOT): 15 U/L (ref 11–34)
BILIRUBIN TOTAL: 0.5 mg/dL (ref 0.3–1.3)
BUN/CREA RATIO: 12 (ref 6–22)
BUN: 10 mg/dL (ref 8–25)
CALCIUM: 9 mg/dL (ref 8.6–10.2)
CHLORIDE: 111 mmol/L (ref 96–111)
CO2 TOTAL: 20 mmol/L — ABNORMAL LOW (ref 22–30)
CREATININE: 0.81 mg/dL (ref 0.60–1.05)
ESTIMATED GFR - FEMALE: >90 mL/min/BSA (ref >=60)
GLUCOSE: 104 mg/dL (ref 65–125)
POTASSIUM: 4.2 mmol/L (ref 3.5–5.1)
PROTEIN TOTAL: 7.1 g/dL (ref 6.4–8.3)
SODIUM: 140 mmol/L (ref 136–145)

## 2023-08-25 LAB — CBC WITH DIFF
BASOPHIL #: <0.1 10*3/uL (ref <=0.20)
BASOPHIL %: 1 %
EOSINOPHIL #: <0.1 10*3/uL (ref <=0.50)
EOSINOPHIL %: 1.2 %
HCT: 39.1 % (ref 34.8–46.0)
HGB: 13.1 g/dL (ref 11.5–16.0)
IMMATURE GRANULOCYTE #: <0.1 10*3/uL (ref <0.10)
IMMATURE GRANULOCYTE %: 0.6 % (ref 0.0–1.0)
LYMPHOCYTE #: 1.7 10*3/uL (ref 1.00–4.80)
LYMPHOCYTE %: 33.5 %
MCH: 26.3 pg (ref 26.0–32.0)
MCHC: 33.5 g/dL (ref 31.0–35.5)
MCV: 78.4 fL (ref 78.0–100.0)
MONOCYTE #: 0.47 10*3/uL (ref 0.20–1.10)
MONOCYTE %: 9.3 %
MPV: 8.3 fL — ABNORMAL LOW (ref 8.7–12.5)
NEUTROPHIL #: 2.77 10*3/uL (ref 1.50–7.70)
NEUTROPHIL %: 54.4 %
PLATELETS: 355 10*3/uL (ref 150–400)
RBC: 4.99 10*6/uL (ref 3.85–5.22)
RDW-CV: 14.5 % (ref 11.5–15.5)
WBC: 5.1 10*3/uL (ref 3.7–11.0)

## 2023-08-25 LAB — URINALYSIS, MACRO/MICRO
BILIRUBIN: NEGATIVE mg/dL
BLOOD: NEGATIVE mg/dL
GLUCOSE: NEGATIVE mg/dL
KETONES: NEGATIVE mg/dL
LEUKOCYTES: NEGATIVE WBCs/uL
NITRITE: NEGATIVE
PH: 7 (ref 5.0–8.5)
PROTEIN: NEGATIVE mg/dL
SPECIFIC GRAVITY: 1.015 (ref 1.005–1.030)
UROBILINOGEN: 0.2 mg/dL

## 2023-08-25 LAB — HCG, SERUM QUALITATIVE, PREGNANCY: PREGNANCY, SERUM QUALITATIVE: NEGATIVE

## 2023-08-25 LAB — GOLD TOP TUBE

## 2023-08-25 LAB — COVID-19 ~~LOC~~ MOLECULAR LAB TESTING
INFLUENZA VIRUS TYPE A: NOT DETECTED
INFLUENZA VIRUS TYPE B: NOT DETECTED
RESPIRATORY SYNCTIAL VIRUS (RSV): NOT DETECTED
SARS-CoV-2: NOT DETECTED

## 2023-08-25 LAB — BLUE TOP TUBE

## 2023-08-25 LAB — TROPONIN-I: TROPONIN-I HS: <2.7 ng/L (ref <=14.0)

## 2023-08-25 LAB — GRAY TOP TUBE

## 2023-08-25 LAB — BNP, LAB ISTAT: BNP, POC: <15 pg/mL (ref <=100)

## 2023-08-25 MED ORDER — ONDANSETRON HCL (PF) 4 MG/2 ML INJECTION SOLUTION
4.0000 mg | INTRAMUSCULAR | Status: AC
Start: 2023-08-25 — End: 2023-08-25
  Administered 2023-08-25: 4 mg via INTRAVENOUS
  Filled 2023-08-25: qty 2

## 2023-08-25 MED ORDER — ONDANSETRON 4 MG DISINTEGRATING TABLET
4.0000 mg | ORAL_TABLET | Freq: Three times a day (TID) | ORAL | 0 refills | Status: DC | PRN
Start: 2023-08-25 — End: 2023-08-25

## 2023-08-25 MED ORDER — PROMETHAZINE 25 MG TABLET
25.0000 mg | ORAL_TABLET | Freq: Three times a day (TID) | ORAL | 0 refills | Status: AC | PRN
Start: 2023-08-25 — End: ?

## 2023-08-25 MED ORDER — GI COCKTAIL (ANTACID SUSP, HYOSCYAMINE, LIDOCAINE)
55.0000 mL | ORAL | Status: AC
Start: 2023-08-25 — End: 2023-08-25
  Administered 2023-08-25: 55 mL via ORAL
  Filled 2023-08-25: qty 55

## 2023-08-25 MED ORDER — SODIUM CHLORIDE 0.9 % IV BOLUS
1000.0000 mL | INJECTION | Status: AC
Start: 2023-08-25 — End: 2023-08-25
  Administered 2023-08-25: 0 mL via INTRAVENOUS
  Administered 2023-08-25: 1000 mL via INTRAVENOUS

## 2023-08-25 NOTE — Discharge Instructions (Addendum)
I would like you to rest and drink plenty of fluids over the next 24-48 hours.  Feel free to come back to the emergency department if you are unable to keep anything down, develop exertional chest pain relieved with rest, or develop lightheadedness dizziness or passing out.

## 2023-08-25 NOTE — ED Nurses Note (Addendum)
Patient refuses wheelchair and ambulates out of ED VSS: MAE: AOx4 with all personal belongings and printed AVS. Patient accompanied by significant other who is driver.

## 2023-08-25 NOTE — ED Attending Handoff Note (Signed)
Patient handed off to me at shift change with nausea, vomiting, chest pain that developed after vomiting.  A workup is negative and she is feeling better.  Will discharge with Phenergan with likely viral gastroenteritis.

## 2023-08-25 NOTE — ED Provider Notes (Signed)
[facility]  Emergency Department  08/25/2023     [name]  09/01/2000  23 y.o.  female  [place] [address]   [phone] (home)  PCP: [facility]   Date of service:08/25/2023 06:01    Chief Complaint:   Chief Complaint   Patient presents with    Chest Pain      Pt presenting with left sided chest pain radiating to the left arm, pt also vomiting and reporting SOB. pt awoke from rest at 0300 this morning with the pain.  Pt took 81 mg of aspirin, pt has tetrology of fallot.             HPI: This is a 23 y.o. female who presents to the emergency department complaining of  chest pain    Patient states she woke at proximally 3:00 a.m. this morning with nausea and nonbloody vomiting, she is complaining of a dull pain in the center of her chest radiating to the left arm and down the left hand, has a history of tetralogy of Fallot, [facility], denies any shortness a breath or fever, denies any known sick contacts    Came to the ER for further evaluation          Past Medical History:   Past Medical History:   Diagnosis Date    Attention deficit disorder 08/26/2011    Chest pain     Has had negative nonnuclear stress tests in October of 2008 and March 2001. Last Holter February 13 demonstrated 13 PVC's with one couplets and normal low and high rates.     Constipation     Has been seen multiple times by GI for this.     Fallot tetralogy     Fever of unknown origin 08/30/2007    GERD (gastroesophageal reflux disease)     Headache     Heart disease     Impaired hearing     Past history of secretory otitis.     Otitis media     Piercing     Psychiatric problem     Rash     Eczema in scalp    Seizure (CMS HCC)     Treated in the past for seizures and followed by a neurologist.    STD (sexually transmitted disease) 07/2015    chlamydia    Thalassemia trait     Unspecified deficiency anemia     Hemoglobin electrophoresis has shown 87%  A, 3.5% A2 and 9% fetal in past.  Patient eats one meal a day. She eats ice.      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         Social History:   Social History     Tobacco Use    Smoking status: Former     Types: Cigarettes    Smokeless tobacco: Never    Tobacco comments:     Former smoker - stopped when pregnant    Substance Use Topics    Alcohol use: No     Alcohol/week: 0.0 standard drinks  of alcohol    Drug use: No        Family History:  Family History   Problem Relation Age of Onset    Congestive Heart Failure Other     Seizures Brother         Grand mal/petit mal seizures     SLE Other     Multiple Sclerosis Other     ADHD/ADD Other     Unspecified Intellectual Disability Other            Medications Prior to Admission       Prescriptions    ARIPiprazole (ABILIFY) 5 mg Oral Tablet    Take 1 Tablet (5 mg total) by mouth Once a day    aspirin 81 mg Oral Tablet, Chewable    Chew 1 Tablet (81 mg total) Once a day    traZODone (DESYREL) 50 mg Oral Tablet    Take 1 Tablet (50 mg total) by mouth Every night    valACYclovir (VALTREX) 1 gram Oral Tablet    Take 1 Tablet (1 g total) by mouth Twice daily            Allergies: No Known Allergies    Above history reviewed with patient.  Allergies, medication list, reviewed.       Review of Systems - All other systems reviewed and are negative unless noted in HPI.      Physical exam:  Body mass index is 27.34 kg/m.  ED Triage Vitals [08/25/23 0551]   BP (Non-Invasive) 117/79   Heart Rate 93   Respiratory Rate 16   Temperature 36.6 C (97.8 F)   SpO2 100 %   Weight 63.5 kg (140 lb)   Height 1.524 m (5')       Physical Exam  Vitals and nursing note reviewed.   Constitutional:       Appearance: Normal appearance.   Cardiovascular:      Rate  and Rhythm: Normal rate and regular rhythm.      Pulses: Normal pulses.      Heart sounds: Murmur (Mild 2/6 systolic ejection murmur heard best at the left sternal border) heard.   Pulmonary:      Effort: Pulmonary effort is normal.      Breath sounds: Normal breath sounds. No rales.   Abdominal:      General: Abdomen is flat. There is no distension.      Palpations: Abdomen is soft.      Tenderness: There is abdominal tenderness (mild epigastric). There is no guarding or rebound.   Musculoskeletal:         General: No swelling.   Neurological:      General: No focal deficit present.      Mental Status: She is alert.   Psychiatric:         Mood and Affect: Mood normal.         Behavior: Behavior normal.         Thought Content: Thought content normal.         Judgment: Judgment normal.          The following orders were placed:  Orders Placed This Encounter    XR AP MOBILE CHEST    CBC/DIFF    COMPREHENSIVE METABOLIC PANEL, NON-FASTING    CANCELED: B-TYPE NATRIURETIC PEPTIDE    TROPONIN-I    URINALYSIS WITH REFLEX MICROSCOPIC AND CULTURE IF POSITIVE    LIPASE    CBC WITH DIFF    URINALYSIS, MACRO/MICRO  BNP, LAB ISTAT    EXTRA TUBES    BLUE TOP TUBE    GOLD TOP TUBE    GRAY TOP TUBE    HCG, SERUM QUALITATIVE, PREGNANCY    COVID - 19 SCREENING - Symptomatic - PUI    ECG 12-LEAD    NS bolus infusion 1,000 mL    ondansetron (ZOFRAN) 2 mg/mL injection    GI cocktail (ANTACID SUSP, HYOSCYAMINE, LIDOCAINE) oral solution    promethazine (PHENERGAN) 25 mg Oral Tablet      XR AP MOBILE CHEST   Final Result   No acute process.                           Radiologist location ID: [id number]            Labs Ordered/Reviewed   COMPREHENSIVE METABOLIC PANEL, NON-FASTING - Abnormal; Notable for the following components:       Result Value    CO2 TOTAL 20 (*)     All other components within normal limits   CBC WITH DIFF - Abnormal; Notable for the following components:    MPV 8.3 (*)     All other components within normal limits    TROPONIN-I - Normal   LIPASE - Normal   URINALYSIS, MACRO/MICRO - Normal   BNP, LAB ISTAT - Normal   HCG, SERUM QUALITATIVE, PREGNANCY - Normal   COVID-19 [place] MOLECULAR LAB TESTING - Normal    Narrative:     Results are for the simultaneous qualitative identification of SARS-CoV-2 (formerly 2019-nCoV), Influenza A, Influenza B, and RSV RNA. These etiologic agents are generally detectable in nasopharyngeal and nasal swabs during the ACUTE PHASE of infection. Hence, this test is intended to be performed on respiratory specimens collected from individuals with signs and symptoms of upper respiratory tract infection who meet Centers for Disease Control and Prevention (CDC) clinical and/or epidemiological criteria for Coronavirus Disease 2019 (COVID-19) testing. CDC COVID-19 criteria for testing on human specimens is available at [facility] webpage information for Healthcare Professionals: Coronavirus Disease 2019 (COVID-19) ([email or web address]).                                     False-negative results may occur if the virus has genomic mutations, insertions, deletions, or rearrangements or if performed very early in the course of illness. Otherwise, negative results indicate virus specific RNA targets are not detected, however negative results do not preclude SARS-CoV-2 infection/COVID-19, Influenza, or Respiratory syncytial virus infection. Results should not be used as the sole basis for patient management decisions. Negative results must be combined with clinical observations, patient history, and epidemiological information. If upper respiratory tract infection is still suspected based on exposure history together with other clinical findings, re-testing should be considered.                                    Test methodology:                   Cepheid Xpert Xpress SARS-CoV-2/Flu/RSV Assay real-time polymerase chain reaction (RT-PCR) test on the GeneXpert Dx and Xpert Xpress  systems.   CBC/DIFF    Narrative:     The following orders were created for panel order CBC/DIFF.  Procedure                               Abnormality         Status                                     ---------                               -----------         ------                                     CBC WITH WJXB[[id number]]                Abnormal            Final result                                                 Please view results for these tests on the individual orders.   URINALYSIS WITH REFLEX MICROSCOPIC AND CULTURE IF POSITIVE    Narrative:     The following orders were created for panel order URINALYSIS WITH REFLEX MICROSCOPIC AND CULTURE IF POSITIVE.                  Procedure                               Abnormality         Status                                     ---------                               -----------         ------                                     URINALYSIS, MACRO/MICRO[[id number]]      Normal              Final result                                                 Please view results for these tests on the individual orders.      No results found for this or any previous visit (from the past 12 hours).      ED Course:     ED Course as of 08/27/23 0022   Wed Aug 25, 2023   0625 WBC: 5.1   0625 HGB: 13.1   0625 HCT: 39.1   0625 PLATELET COUNT: 355   0625 BUN: 10  8295 CREATININE: 0.81   0625 CARBON DIOXIDE(!): 20   0625 ALKALINE PHOSPHATASE: 76   0625 ALT (SGPT): 14   0625 AST (SGOT): 15   0625 BILIRUBIN, TOTAL: 0.5   0625 PREGNANCY, SERUM QUALITATIVE: Negative   0625 LIPASE: 26   0625 TROPONIN-I HS: <2.7   0625 BNP, POC: <15   0625 URINALYSIS WITH REFLEX MICROSCOPIC AND CULTURE IF POSITIVE  Negative      Medications Administered in the ED   NS bolus infusion 1,000 mL (0 mL Intravenous Stopped 08/25/23 0720)   ondansetron (ZOFRAN) 2 mg/mL injection (4 mg Intravenous Given 08/25/23 0607)   GI cocktail (ANTACID SUSP, HYOSCYAMINE, LIDOCAINE) oral solution (55 mL  Oral Given 08/25/23 0607)        Emergency Department Procedure:    EKG Interpertation:  Normal sinus rhythm rate 96 right bundle-branch block with left axis deviation no specific findings for ischemia    Procedures    ED events:                       Clinical Impression:   Clinical Impression   Chest pain, unspecified type (Primary)   Acute gastritis       Disposition: Discharged        Medical Decision Making:    Ddx (including but not limited to):  Acute coronary syndrome congestive heart failure PE gastritis esophagitis cholecystitis bowel obstruction bowel perforation intussusception volvulus urinary tract infection kidney stone sepsis malignancy    Data (all images independently viewed by me): nurses notes and vital signs reviewed    Labs:  White count 5.1 hemoglobin 13.1 platelets 355 bicarb 20 BUN and creatinine are normal LFTs are normal lipase is normal troponin BNP are negative pregnancy test is negative urinalysis negative COVID and flu are pending    Imaging:  Chest x-ray with no acute findings    Records:  Recent echocardiogram with no specific abnormalities    Consult/Discussion:  Patient came in with chest pain was given a GI cocktail because of the vomiting and states that her symptoms have improved greatly, suspect that the chest pain was related to GI source likely esophagitis from emesis, advised that if her flu and COVID are negative she can be discharged home with antiemetics and rest but since we have had so much flu and she does have chronic comorbidities I have recommended that she stay and get the results of these tests in case she would need Tamiflu or Paxlovid to prevent complications, patient was in agreement, results of the labs were pending at time of shift change so care was transferred to Dr. Randel Pigg who will f/u on results of covid/flu testing    Plan:  Suspect discharge home      Findings and diagnosis discussed with patient/family who is/are agreeable with plan            No  follow-ups on file.   Discharge Medication List as of 08/25/2023  8:20 AM        START taking these medications    Details   promethazine (PHENERGAN) 25 mg Oral Tablet Take 1 Tablet (25 mg total) by mouth Every 8 hours as needed for Nausea/Vomiting, Disp-12 Tablet, R-0, E-Rx            [facility] - Emergency Department  [address]  [place] [address]  [phone]    As needed    [facility]  [address]  [place] [address]  [phone]      As needed      Future Appointments  No future appointments.     BP 105/84   Pulse 91   Temp 36.5 C (97.7 F)   Resp (!) 21   Ht 1.524 m (5')   Wt 63.5 kg (140 lb)   SpO2 97%   BMI 27.34 kg/m        Ruth Kovich W. Roseanne Reno DO 08/25/2023              This note was partially created using voice recognition software and is inherently subject to errors including those of syntax and "sound alike " substitutions which may escape proof reading.  In such instances, original meaning may be extrapolated by contextual derivation.

## 2023-09-29 ENCOUNTER — Emergency Department (HOSPITAL_COMMUNITY): Payer: MEDICAID

## 2023-09-29 ENCOUNTER — Encounter (HOSPITAL_COMMUNITY): Payer: Self-pay

## 2023-09-29 ENCOUNTER — Other Ambulatory Visit: Payer: Self-pay

## 2023-09-29 ENCOUNTER — Emergency Department
Admission: EM | Admit: 2023-09-29 | Discharge: 2023-09-29 | Disposition: A | Discharge status: Home / Self Care | Payer: MEDICAID | Attending: Emergency Medicine | Admitting: Emergency Medicine

## 2023-09-29 DIAGNOSIS — Z952 Presence of prosthetic heart valve: Secondary | ICD-10-CM | POA: Insufficient documentation

## 2023-09-29 DIAGNOSIS — R079 Chest pain, unspecified: Secondary | ICD-10-CM | POA: Insufficient documentation

## 2023-09-29 DIAGNOSIS — Z8774 Personal history of (corrected) congenital malformations of heart and circulatory system: Secondary | ICD-10-CM | POA: Insufficient documentation

## 2023-09-29 LAB — CBC WITH DIFF
BASOPHIL #: <0.1 10*3/uL (ref <=0.20)
BASOPHIL %: 0.5 %
EOSINOPHIL #: <0.1 10*3/uL (ref <=0.50)
EOSINOPHIL %: 0.4 %
HCT: 40.8 % (ref 34.8–46.0)
HGB: 13.6 g/dL (ref 11.5–16.0)
IMMATURE GRANULOCYTE #: <0.1 10*3/uL (ref <0.10)
IMMATURE GRANULOCYTE %: 0.3 % (ref 0.0–1.0)
LYMPHOCYTE #: 3.12 10*3/uL (ref 1.00–4.80)
LYMPHOCYTE %: 39.5 %
MCH: 25.8 pg — ABNORMAL LOW (ref 26.0–32.0)
MCHC: 33.3 g/dL (ref 31.0–35.5)
MCV: 77.4 fL — ABNORMAL LOW (ref 78.0–100.0)
MONOCYTE #: 0.52 10*3/uL (ref 0.20–1.10)
MONOCYTE %: 6.6 %
MPV: 8.3 fL — ABNORMAL LOW (ref 8.7–12.5)
NEUTROPHIL #: 4.17 10*3/uL (ref 1.50–7.70)
NEUTROPHIL %: 52.7 %
PLATELETS: 367 10*3/uL (ref 150–400)
RBC: 5.27 10*6/uL — ABNORMAL HIGH (ref 3.85–5.22)
RDW-CV: 14.6 % (ref 11.5–15.5)
WBC: 7.9 10*3/uL (ref 3.7–11.0)

## 2023-09-29 LAB — COMPREHENSIVE METABOLIC PANEL, NON-FASTING
ALBUMIN: 4.6 g/dL (ref 3.5–5.0)
ALKALINE PHOSPHATASE: 69 U/L (ref 40–110)
ALT (SGPT): 21 U/L (ref <31)
ANION GAP: 10 mmol/L (ref 4–13)
AST (SGOT): 19 U/L (ref 11–34)
BILIRUBIN TOTAL: 0.8 mg/dL (ref 0.3–1.3)
BUN/CREA RATIO: 11 (ref 6–22)
BUN: 9 mg/dL (ref 8–25)
CALCIUM: 9.7 mg/dL (ref 8.6–10.2)
CHLORIDE: 109 mmol/L (ref 96–111)
CO2 TOTAL: 21 mmol/L — ABNORMAL LOW (ref 22–30)
CREATININE: 0.82 mg/dL (ref 0.60–1.05)
ESTIMATED GFR - FEMALE: >90 mL/min/BSA (ref >=60)
GLUCOSE: 99 mg/dL (ref 65–125)
POTASSIUM: 3.6 mmol/L (ref 3.5–5.1)
PROTEIN TOTAL: 7.4 g/dL (ref 6.0–7.9)
SODIUM: 140 mmol/L (ref 136–145)

## 2023-09-29 LAB — TROPONIN-I: TROPONIN-I HS: <2.7 ng/L (ref <=14.0)

## 2023-09-29 LAB — DRUG SCREEN, NO CONFIRMATION, URINE
AMPHETAMINES, URINE: NEGATIVE
BARBITURATES URINE: NEGATIVE
BENZODIAZEPINES URINE: NEGATIVE
BUPRENORPHINE URINE: NEGATIVE
CANNABINOIDS URINE: NEGATIVE
COCAINE METABOLITES URINE: NEGATIVE
CREATININE RANDOM URINE: 136.65 mg/dL (ref >=20.00)
ECSTASY/MDMA URINE: NEGATIVE
FENTANYL, RANDOM URINE: NEGATIVE
METHADONE URINE: NEGATIVE
OPIATES URINE (LOW CUTOFF): NEGATIVE
OXYCODONE URINE: NEGATIVE

## 2023-09-29 LAB — CREATINE KINASE (CK), TOTAL, SERUM OR PLASMA: CREATINE KINASE: 142 U/L (ref 25–190)

## 2023-09-29 LAB — LAVENDER TOP TUBE

## 2023-09-29 LAB — ECG 12-LEAD
Atrial Rate: 101 {beats}/min
Calculated P Axis: 59 degrees
Calculated R Axis: -37 degrees
Calculated T Axis: 66 degrees
PR Interval: 140 ms
QRS Duration: 140 ms
QT Interval: 382 ms
QTC Calculation: 495 ms
Ventricular rate: 101 {beats}/min

## 2023-09-29 LAB — URINE PREGNANCY, HCG: HCG URINE QUALITATIVE: NEGATIVE

## 2023-09-29 LAB — BLUE TOP TUBE

## 2023-09-29 LAB — GOLD TOP TUBE

## 2023-09-29 LAB — LIGHT GREEN TOP TUBE

## 2023-09-29 NOTE — ED Triage Notes (Signed)
 Chest pain that started yesterday, [facility] and got [facility] and was told to come here  Chest pain in center of her chest, up her neck and down her arm.  81 mg asa this morning

## 2023-09-29 NOTE — ED Provider Notes (Signed)
 Emergency Department  [facility]   09/29/2023     [name]  04/04/2001  23 y.o.  female  [place] [address]   [phone] (home)  PCP: [facility]   Date of service:09/29/2023 12:55    Chief Complaint:   Chief Complaint   Patient presents with    Chest Pain            HPI: This is a 23 y.o. female who presents to the emergency department complaining of onset last night of left upper chest pain radiating into the lower part of the neck.  No other associated symptoms.  Patient has a history of tetralogy of Fallot repair and a tissue valve replacement although she is unsure which valve.                Past Medical History:   Past Medical History:   Diagnosis Date    Attention deficit disorder 08/26/2011    Chest pain     Has had negative nonnuclear stress tests in October of 2008 and March 2001. Last Holter February 13 demonstrated 13 PVC's with one couplets and normal low and high rates.     Constipation     Has been seen multiple times by GI for this.     Fallot tetralogy     Fever of unknown origin 08/30/2007    GERD (gastroesophageal reflux disease)     Headache     Heart disease     Impaired hearing     Past history of secretory otitis.     Otitis media     Piercing     Psychiatric problem     Rash     Eczema in scalp    Seizure (CMS HCC)     Treated in the past for seizures and followed by a neurologist.    STD (sexually transmitted disease) 07/2015    chlamydia    Thalassemia trait     Unspecified deficiency anemia     Hemoglobin electrophoresis has shown 87% A, 3.5% A2 and 9% fetal in past.  Patient eats one meal a day. She eats ice.      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         Social History:   Social History      Tobacco Use    Smoking status: Former     Types: Cigarettes    Smokeless tobacco: Never    Tobacco comments:     Former smoker - stopped when pregnant    Substance Use Topics    Alcohol use: No     Alcohol/week: 0.0 standard drinks of alcohol    Drug use: No        Family History:  Family History   Problem Relation Age of Onset    Congestive Heart Failure Other     Seizures Brother         Grand mal/petit mal seizures     SLE Other     Multiple Sclerosis Other     ADHD/ADD Other     Unspecified Intellectual Disability Other  Prior to Admission medications    Medication Sig Start Date End Date Taking? Authorizing Provider   ARIPiprazole (ABILIFY) 5 mg Oral Tablet Take 1 Tablet (5 mg total) by mouth Once a day    Provider, Historical   aspirin 81 mg Oral Tablet, Chewable Chew 1 Tablet (81 mg total) Once a day 07/23/23   Pyles, Lilia Pro, MD   promethazine (PHENERGAN) 25 mg Oral Tablet Take 1 Tablet (25 mg total) by mouth Every 8 hours as needed for Nausea/Vomiting 08/25/23   Odis Hollingshead, MD   traZODone (DESYREL) 50 mg Oral Tablet Take 1 Tablet (50 mg total) by mouth Every night    Provider, Historical   valACYclovir (VALTREX) 1 gram Oral Tablet Take 1 Tablet (1 g total) by mouth Twice daily    Provider, Historical          Allergies: No Known Allergies    Above history reviewed with patient.  Allergies, medication list, reviewed.          Physical Exam  Body mass index is 27.34 kg/m.  ED Triage Vitals [09/29/23 1117]   BP (Non-Invasive) 129/85   Heart Rate 92   Respiratory Rate 18   Temperature 36.8 C (98.3 F)   SpO2 99 %   Weight 63.5 kg (140 lb)   Height 1.524 m (5')     Constitutional: patient is oriented to person, place, and time and well-developed, well-nourished, and in no distress.   HENT:   Head: Normocephalic and atraumatic.   Right Ear: External ear normal.   Left Ear: External ear normal.   Nose: Nose normal.   Mouth/Throat: Oropharynx is clear and moist.   Eyes: Pupils are equal, round, and  reactive to light. Conjunctivae and EOM are normal.   Neck: Normal range of motion. Neck supple.   Cardiovascular: Normal rate, regular rhythm, normal heart sounds and intact distal pulses.   Pulmonary/Chest: Effort normal and breath sounds normal.  Mild tenderness of the left upper chest wall.  Abdominal: Soft. Bowel sounds are normal.   Musculoskeletal: Normal range of motion.   Neurological:Patient is alert and oriented to person, place, and time.  Gait normal. GCS score is 15.   Skin: Skin is warm and dry.   Psychiatric: Mood, memory, affect and judgment normal.    The following orders were placed after examining the patient :  Orders Placed This Encounter    XR AP MOBILE CHEST    EXTRA TUBES    BLUE TOP TUBE    GOLD TOP TUBE    LIGHT GREEN TOP TUBE    LAVENDER TOP TUBE    CBC/DIFF    COMPREHENSIVE METABOLIC PANEL, NON-FASTING    CREATINE KINASE (CK), TOTAL, SERUM    TROPONIN-I    DRUG SCREEN, NO CONFIRMATION, URINE    URINE PREGNANCY, HCG    CBC WITH DIFF    ECG 12-LEAD    INSERT & MAINTAIN PERIPHERAL IV ACCESS      XR AP MOBILE CHEST   Final Result   No acute pulmonary process.      mah         Radiologist location ID: [id number]            Results for orders placed or performed during the hospital encounter of 09/29/23 (from the past 12 hours)   COMPREHENSIVE METABOLIC PANEL, NON-FASTING   Result Value Ref Range    SODIUM 140 136 - 145 mmol/L    POTASSIUM 3.6 3.5 - 5.1 mmol/L  CHLORIDE 109 96 - 111 mmol/L    CO2 TOTAL 21 (L) 22 - 30 mmol/L    ANION GAP 10 4 - 13 mmol/L    BUN 9 8 - 25 mg/dL    CREATININE 1.61 0.96 - 1.05 mg/dL    BUN/CREA RATIO 11 6 - 22    ESTIMATED GFR - FEMALE >90 >=60 mL/min/BSA    ALBUMIN 4.6 3.5 - 5.0 g/dL    CALCIUM 9.7 8.6 - 04.5 mg/dL    GLUCOSE 99 65 - 409 mg/dL    ALKALINE PHOSPHATASE 69 40 - 110 U/L    ALT (SGPT) 21 <31 U/L    AST (SGOT)  19 11 - 34 U/L    BILIRUBIN TOTAL 0.8 0.3 - 1.3 mg/dL    PROTEIN TOTAL 7.4 6.0 - 7.9 g/dL   CREATINE KINASE (CK), TOTAL, SERUM   Result  Value Ref Range    CREATINE KINASE 142 25 - 190 U/L   TROPONIN-I   Result Value Ref Range    TROPONIN-I HS <2.7 <=14.0 ng/L ng/L   CBC WITH DIFF   Result Value Ref Range    WBC 7.9 3.7 - 11.0 x10^3/uL    RBC 5.27 (H) 3.85 - 5.22 x10^6/uL    HGB 13.6 11.5 - 16.0 g/dL    HCT 81.1 91.4 - 78.2 %    MCV 77.4 (L) 78.0 - 100.0 fL    MCH 25.8 (L) 26.0 - 32.0 pg    MCHC 33.3 31.0 - 35.5 g/dL    RDW-CV 95.6 21.3 - 08.6 %    PLATELETS 367 150 - 400 x10^3/uL    MPV 8.3 (L) 8.7 - 12.5 fL    NEUTROPHIL % 52.7 %    LYMPHOCYTE % 39.5 %    MONOCYTE % 6.6 %    EOSINOPHIL % 0.4 %    BASOPHIL % 0.5 %    NEUTROPHIL # 4.17 1.50 - 7.70 x10^3/uL    LYMPHOCYTE # 3.12 1.00 - 4.80 x10^3/uL    MONOCYTE # 0.52 0.20 - 1.10 x10^3/uL    EOSINOPHIL # <0.10 <=0.50 x10^3/uL    BASOPHIL # <0.10 <=0.20 x10^3/uL    IMMATURE GRANULOCYTE % 0.3 0.0 - 1.0 %    IMMATURE GRANULOCYTE # <0.10 <0.10 x10^3/uL   DRUG SCREEN, NO CONFIRMATION, URINE   Result Value Ref Range    AMPHETAMINES, URINE Negative Negative    BARBITURATES URINE Negative Negative    BENZODIAZEPINES URINE Negative Negative    BUPRENORPHINE URINE Negative Negative    CANNABINOIDS URINE Negative Negative    COCAINE METABOLITES URINE Negative Negative    METHADONE URINE Negative Negative    OPIATES URINE (LOW CUTOFF) Negative Negative    OXYCODONE URINE Negative Negative    ECSTASY/MDMA URINE Negative Negative    FENTANYL, RANDOM URINE Negative Negative    CREATININE RANDOM URINE 136.65 >=20.00 mg/dL   URINE PREGNANCY, HCG   Result Value Ref Range    HCG URINE QUALITATIVE Negative Negative         ED Course:   ED Course as of 09/29/23 1300   Wed Sep 29, 2023   1207 CBC/DIFF(!)  Unremarkable   1207 COMPREHENSIVE METABOLIC PANEL, NON-FASTING(!)  Normal   1207 CREATINE KINASE (CK), TOTAL, SERUM  Normal   1207 TROPONIN-I  Troponin negative   1252 URINE PREGNANCY, HCG  Pregnancy negative   1252 DRUG SCREEN, NO CONFIRMATION, URINE  Drug screen negative   1253 XR AP MOBILE CHEST  Chest x-ray personally  reviewed  by me shows no acute process            Emergency Department Procedure:    EKG Interpertation:  Sinus rhythm at 101 beats per minute with right bundle-branch block.  Normal PR and QT intervals.  Left axis deviation.  No ST elevation or depression.  Unchanged from previous EKG.  Procedures    Medical Decision Making  Differential includes acute coronary syndrome, chest wall pain, no clinical suspicion of PE.    Problems Addressed:  Chest pain, unspecified type: acute illness or injury     Details: Noncardiac    Amount and/or Complexity of Data Reviewed  External Data Reviewed: ECG.     Details: Old EKG reviewed  Labs: ordered. Decision-making details documented in ED Course.  Radiology: ordered and independent interpretation performed. Decision-making details documented in ED Course.  ECG/medicine tests: ordered and independent interpretation performed. Decision-making details documented in ED Course.            Findings and diagnosis discussed with patient.  Clinical Impression:   Clinical Impression   Chest pain, unspecified type (Primary)   History of tetralogy of Fallot repair   History of heart valve replacement                 Disposition: Discharged          No follow-ups on file.   New Prescriptions    No medications on file      [facility]  [address]  [place] [address]  [phone]           BP 115/81   Pulse 80   Temp 36.8 C (98.3 F)   Resp 18   Ht 1.524 m (5')   Wt 63.5 kg (140 lb)   SpO2 96%   BMI 27.34 kg/m        Odis Hollingshead, MD3/19/2025              This note was partially created using voice recognition software and is inherently subject to errors including those of syntax and "sound alike " substitutions which may escape proof reading.  In such instances, original meaning may be extrapolated by contextual derivation.

## 2023-09-29 NOTE — ED Nurses Note (Signed)
 Patient educated on discharge instructions, patient verbalized understanding. PIV removed with catheter intact. VSS at time of triage. Patient to ambulate to POV

## 2024-03-08 ENCOUNTER — Ambulatory Visit (INDEPENDENT_AMBULATORY_CARE_PROVIDER_SITE_OTHER): Payer: Self-pay

## 2024-03-08 NOTE — Telephone Encounter (Addendum)
 Spoke with patient regarding concerns about medications prescribed by urgent care. She states she is currently only taking Trazodone but was prescribed Robaxin and Ibuprofen  (for up to 10 days) by urgent care and wanted to make sure she was able to take these. Per last clinic note, Dr. Estelita recommended patient take a daily baby aspirin . Patient states she has not been taking Aspirin , but will start this again. Instructed patient to be aware of increased bleeding if taking the Ibuprofen  with Aspirin , but let her know that meds should be safe for her to take given her current medication list. Patient was instructed to reach out with any further questions or concerns.    Vernell Nam, NURSE COORDINATOR    Regarding: Clinical Question  ----- Message from [name] sent at 03/08/2024  2:06 PM EDT -----  Copied From CRM #[id number].[name] (Self) called with a clinical question.   She is calling it see if she is able to take muscle relaxer?  These were prescribed to her at [facility].  Please advise. Thank you

## 2024-03-24 ENCOUNTER — Encounter (INDEPENDENT_AMBULATORY_CARE_PROVIDER_SITE_OTHER): Payer: Self-pay | Admitting: PEDIATRICS-PEDIATRIC CARDIOLOGY

## 2024-03-24 ENCOUNTER — Ambulatory Visit (INDEPENDENT_AMBULATORY_CARE_PROVIDER_SITE_OTHER): Payer: Self-pay

## 2024-03-24 NOTE — Telephone Encounter (Signed)
 Patient did not show for appointment today.  Called and spoke to her.  Appointment rescheduled.  Advised to journal chest pain and try Tums 3  different times.  She verbalized understanding.

## 2024-04-14 ENCOUNTER — Encounter (INDEPENDENT_AMBULATORY_CARE_PROVIDER_SITE_OTHER): Payer: Self-pay

## 2024-04-14 ENCOUNTER — Telehealth (INDEPENDENT_AMBULATORY_CARE_PROVIDER_SITE_OTHER): Payer: Self-pay

## 2024-04-14 NOTE — Telephone Encounter (Signed)
 Called in regard to rescheduling Clinical Visit to [facility]. Unable to leave message with patient. Will [employer] message with updated information.       Harlene Gull, PATIENT NAVIGATOR  04/14/2024 09:34

## 2024-04-28 ENCOUNTER — Encounter (INDEPENDENT_AMBULATORY_CARE_PROVIDER_SITE_OTHER): Payer: Self-pay | Admitting: PEDIATRICS-PEDIATRIC CARDIOLOGY

## 2024-04-28 ENCOUNTER — Other Ambulatory Visit: Payer: Self-pay

## 2024-04-28 ENCOUNTER — Ambulatory Visit (INDEPENDENT_AMBULATORY_CARE_PROVIDER_SITE_OTHER): Payer: MEDICAID | Admitting: PEDIATRICS-PEDIATRIC CARDIOLOGY

## 2024-04-28 ENCOUNTER — Ambulatory Visit
Admission: RE | Admit: 2024-04-28 | Discharge: 2024-04-28 | Disposition: A | Payer: MEDICAID | Source: Ambulatory Visit | Attending: PEDIATRICS-PEDIATRIC CARDIOLOGY

## 2024-04-28 VITALS — BP 118/74 | HR 95 | Temp 97.9°F | Resp 25 | Ht 63.0 in | Wt 140.2 lb

## 2024-04-28 DIAGNOSIS — Q213 Tetralogy of Fallot: Secondary | ICD-10-CM

## 2024-04-28 NOTE — Patient Instructions (Signed)
 Take ibuprofen  400 mg po TID x 7 days.  Call PCP or us  with any stomach ache or dark, tarry smelly stools.  We will call in 2-3 weeks for update.   Jama Khat , MD

## 2024-04-28 NOTE — Progress Notes (Signed)
 [facility]   Cardiac  Clinic Note  Pediatric Clinic         History of Present Illness:     I was asked to see this patient by Primary Care Provider [facility] to consult regarding cardiac issues.     Pt and husband here today and say [name] is doing well at home     Patient is a 23 y.o. y.o. female who presents for follow-up of a heart concern.    1.  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 She underwent complete correction of the defect on July 27, 2000, with Dacron patch closure of a large malalignment ventricular septal defect,   infundibular muscle resection and transannular right ventricular outflow tract pericardial patch, as well as primary suture closure of a moderate-sized secundum atrial septal defect.   She later underwent pulmonary valve replacement with a #27 Carpentier-Edwards pericardial bioprosthetic valve and bovine pericardial patch angioplasty of the main pulmonary artery on August 18, 2007.  On 02/01/2014 she underwent excision of the old #27 Carpentier-Edwards bioprosthetic pulmonary valve, pulmonary valve replacement with a #29 Medtronic Mosaic porcine bioprosthetic pulmonary valve, bovine pericardial patch angioplasty of the main pulmonary artery at the pulmonary valve   insertion site    The murmur was first heard at birth    Symptoms:  Cyanosis: none  Diaphoresis: no  Respiratory Distress: none  Exercise Intolerance: none  Chest Pain: none  Palpitations: none  Syncope: none  Edema: no  Feeding Intolerance: no  Poor Growth: no      Last Echocardiogram - 04/16/23    Conclusions:  1. There is a bioprosthetic pulmonary valve (#29 porcine) with thickened leaflets that are functioning.  2. Peak gradient across the pulmonary valve measures 24 mm Hg. Mean gradient 13 mmHg.There is no significant pulmonary insufficiency.  3. RV pressure by TR jet measures 27 mm Hg plus mean right atrial pressure.  4. Mild RV dilation. The right ventricular  systolic function is low normal.  5. Normal LV size and function.    I have reviewed past medical family and social history with the patient or family.      Past Medical History:   No Recent Hospitalizations  No Recent Operations    Family and Social History:   No family history of congenital heart disease   I have reviewed and updated as appropriate the past medical, family and social history.               Review of Systems:   A comprehensive Review of Systems was performed is negative other than noted in the HPI  CV and Pulm ROS  are neg  No DOE, sob, cyanosis, edema, cough, wheeze, syncope, chest pain, palpitations          Medications:   I have reviewed this patient's current medications    Outpatient Medications Marked as Taking for the 04/28/24 encounter (Office Visit) with Estelita Jama LABOR, MD   Medication Sig    aspirin  81 mg Oral Tablet, Chewable Chew 1 Tablet (81 mg total) Once a day    promethazine  (PHENERGAN ) 25 mg Oral Tablet Take 1 Tablet (25 mg total) by mouth Every 8 hours as needed for Nausea/Vomiting    traZODone (DESYREL) 50 mg Oral Tablet Take 1 Tablet (50 mg total) by mouth Every night    valACYclovir (VALTREX) 1 gram Oral Tablet Take 1 Tablet (1 g total) by mouth Twice daily  Physical Exam:     Blood pressure 118/74, pulse 95, temperature 36.6 C (97.9 F), resp. rate (!) 25, height 1.6 m (5' 3), weight 63.6 kg (140 lb 4 oz), SpO2 100%, unknown if currently breastfeeding.  Body mass index is 24.84 kg/m.  Facility age limit for growth %iles is 20 years.  Height: 160 cm (5' 3)  Weight: 63.6 kg (140 lb 4 oz)  Growth %ile SmartLinks can only be used for patients less than 65 years old.    General - NAD, awake, alert   HEENT - NC/AT EOMI   Cardiac - RRR nl S1 and S2 (loud P2 c/w prosthetic valve) Gr II SEM ULSB  No diastolic murmur No click, thrill or heave   Respiratory - Lungs clear   Abdominal - Liver at [facility]   Extremity  Normal range of motion. Nl pulses in brachial and femoral areas, No  Clubbing, Edema, Cyanosis   Skin - No rash   Neuro - Nl gait, posture, tone         Labs     No new tests today  Ordered 3 day home enroll holter             Assessment and Plan:     [name] is a 23 y.o. female with   Patient Active Problem List   Diagnosis    GERD    Tetralogy of Fallot    Pulmonic valve insufficiency    Status post pulmonary valve replacement    High risk social situation    Observed seizure-like activity (CMS HCC)    Attention deficit disorder (ADD)    Family history of asthma    Unspecified deficiency anemia    Impaired hearing    Seizure (CMS HCC)    Chest pain    Constipation    Spell of transient neurologic symptoms    History of posttraumatic stress disorder (PTSD)    Beta thalassemia (CMS HCC)    Syncopal episodes    Vitamin D  deficiency    Beta thalassemia trait    Menorrhagia    Pregnancy    Alpha thalassemia minor trait            IMP: TOF with absent pulmonary valve  Palliated with bioprosthetic pulmonary valve #29 Medtronic Mosaic porcine bioprosthetic pulmonary   Murmur of mild PS only  DX: TOF adult with great surgical result   Condition is not improving or worsening   Non-specific, non-exertional chest pain continues       PLAN:  F/U in 1 years with exam only  Rec pt takes a daily baby aspirin   No Activity Restrictions  + Need for SBE Prophylaxis for any dental or contaminate surgical procedure   Pt can call [facility] ped card 24-48 hours in advance to get an abx Rx called to pharmacy  No CV Medication EXCEPT    Pt to try 1 week burst of ibuprofen  400 mg three times daily to interrupt the chronic chest pain  Also checking 3 day home enroll ped Holter.         Attending Attestation:         Established Patient    I spent at least 17 minutes of a 30 minute visit in counseling regarding the cardiac concern (00785).        Sincerely,  Jama Khat , MD15:00  Jama Khat , MD   Pediatric Cardiology  Professor of Pediatrics    CC:   Patient Care Team:  [facility], [facility] as PCP - General  (MULTISPECIALTY CLINIC OR GROUP PRACTICE)  Solari, Rosalynn ORN, MD as PCP - Managed Care/Insurance  Nichoals Heyde A    Copy to patient  Chmiel,[name]   [address]  [place] [address]

## 2024-05-12 LAB — PEDS - 3 DAY EXTENDED HOLTER MONITOR (HOME ENROLLMENT)
Enrollment Period End: 20251026211244
Enrollment Period Start: 20251022171252
Heart rate (average): 91 {beats}/min
Isolated VE Counts: 75 episodes
Supraventricular tachycardia - heart rate (average): -1 {beats}/min
Ventricular tachycardia - heart rate (average): -1 {beats}/min

## 2024-07-11 ENCOUNTER — Encounter (HOSPITAL_COMMUNITY): Payer: Self-pay

## 2024-07-11 ENCOUNTER — Emergency Department
Admission: EM | Admit: 2024-07-11 | Discharge: 2024-07-12 | Disposition: A | Discharge status: Home / Self Care | Payer: MEDICAID | Attending: Family Medicine | Admitting: Family Medicine

## 2024-07-11 ENCOUNTER — Other Ambulatory Visit: Payer: Self-pay

## 2024-07-11 DIAGNOSIS — K529 Noninfective gastroenteritis and colitis, unspecified: Secondary | ICD-10-CM | POA: Insufficient documentation

## 2024-07-11 LAB — COMPREHENSIVE METABOLIC PANEL, NON-FASTING
ALBUMIN: 4 g/dL (ref 3.5–5.0)
ALKALINE PHOSPHATASE: 86 U/L (ref 40–110)
ALT (SGPT): 18 U/L (ref <31)
ANION GAP: 11 mmol/L (ref 4–13)
AST (SGOT): 20 U/L (ref 11–34)
BILIRUBIN TOTAL: 0.3 mg/dL (ref 0.3–1.3)
BUN/CREA RATIO: 23 — ABNORMAL HIGH (ref 6–22)
BUN: 17 mg/dL (ref 8–25)
CALCIUM: 9.9 mg/dL (ref 8.6–10.2)
CHLORIDE: 107 mmol/L (ref 96–111)
CO2 TOTAL: 21 mmol/L — ABNORMAL LOW (ref 22–30)
CREATININE: 0.75 mg/dL (ref 0.60–1.05)
GLUCOSE: 88 mg/dL (ref 65–125)
POTASSIUM: 3.9 mmol/L (ref 3.5–5.1)
PROTEIN TOTAL: 6.9 g/dL (ref 6.4–8.3)
SODIUM: 139 mmol/L (ref 136–145)
eGFRcr - FEMALE: >90 mL/min/1.73mˆ2 (ref >=60)

## 2024-07-11 LAB — CBC WITH DIFF
BASOPHIL #: <0.1 x10ˆ3/uL (ref <=0.20)
BASOPHIL %: 0.6 %
EOSINOPHIL #: 0.19 x10ˆ3/uL (ref <=0.50)
EOSINOPHIL %: 2.3 %
HCT: 35.7 % (ref 34.8–46.0)
HGB: 12.4 g/dL (ref 11.5–16.0)
IMMATURE GRANULOCYTE #: <0.1 x10ˆ3/uL (ref <0.10)
IMMATURE GRANULOCYTE %: 0.2 % (ref 0.0–1.0)
LYMPHOCYTE #: 4.02 x10ˆ3/uL (ref 1.00–4.80)
LYMPHOCYTE %: 49.6 %
MCH: 26.6 pg (ref 26.0–32.0)
MCHC: 34.7 g/dL (ref 31.0–35.5)
MCV: 76.4 fL — ABNORMAL LOW (ref 78.0–100.0)
MONOCYTE #: 0.6 x10ˆ3/uL (ref 0.20–1.10)
MONOCYTE %: 7.4 %
MPV: 8.2 fL — ABNORMAL LOW (ref 8.7–12.5)
NEUTROPHIL #: 3.22 x10ˆ3/uL (ref 1.50–7.70)
NEUTROPHIL %: 39.9 %
PLATELETS: 324 x10ˆ3/uL (ref 150–400)
RBC: 4.67 x10ˆ6/uL (ref 3.85–5.22)
RDW-CV: 14.7 % (ref 11.5–15.5)
WBC: 8.1 x10ˆ3/uL (ref 3.7–11.0)

## 2024-07-11 LAB — URINALYSIS, MICROSCOPIC

## 2024-07-11 LAB — URINALYSIS, MACRO/MICRO
BILIRUBIN: NEGATIVE mg/dL
GLUCOSE: NEGATIVE mg/dL
LEUKOCYTES: NEGATIVE WBCs/uL
NITRITE: NEGATIVE
PH: 6 (ref 5.0–8.5)
PROTEIN: NEGATIVE mg/dL
SPECIFIC GRAVITY: >=1.03 (ref 1.005–1.030)
UROBILINOGEN: 0.2 mg/dL

## 2024-07-11 LAB — LIPASE: LIPASE: 58 U/L (ref 10–60)

## 2024-07-11 NOTE — ED Provider Notes (Shared)
 [facility]  Emergency Department  07/11/2024     [name]  2001-03-11  23 y.o.  female  [name] [address]   [phone] (home)  PCP: [facility]   Date of service:07/11/2024 22:32    Chief Complaint:   Chief Complaint   Patient presents with    Abdominal Pain     X2 weeks; blood in stool X2 weeks with N/V           HPI: This is a 23 y.o. female who presents to the emergency department complaining of  abdominal pain diarrhea    Patient reports 2 weeks of nausea vomiting and diarrhea, he tells me she was seen at [facility] 3 days ago and had a CT scan with labs, was told she had an infection but was discharged with Zofran , denies any recent antibiotic use, she states that her nausea and vomiting have improved with the Zofran  but she is still having frequent amounts of watery stool with some dark red blood mixed in with it, she has a history of rectal prolapse but denies any known history of C diff, ulcerative colitis or Crohn's disease, patient denies any fevers, she is having associated upper abdominal sharp stabbing pains, came to the ED for further evaluation          Past Medical History:   Past Medical History:   Diagnosis Date    Attention deficit disorder 08/26/2011    Chest pain     Has had negative nonnuclear stress tests in October of 2008 and March 2001. Last Holter February 13 demonstrated 13 PVC's with one couplets and normal low and high rates.     Constipation     Has been seen multiple times by GI for this.     Fallot tetralogy     Fever of unknown origin 08/30/2007    GERD (gastroesophageal reflux disease)     Headache     Heart disease     Impaired hearing     Past history of secretory otitis.     Otitis media     Piercing     Psychiatric problem     Rash     Eczema in scalp    Seizure (CMS HCC)     Treated in the past for seizures and followed by a neurologist.    STD (sexually transmitted disease) 07/2015     chlamydia    Thalassemia trait     Unspecified deficiency anemia     Hemoglobin electrophoresis has shown 87% A, 3.5% A2 and 9% fetal in past.  Patient eats one meal a day. She eats ice.      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         Social History: Social History[1]     Family History:  Family History   Problem Relation Age of Onset    Congestive Heart Failure Other     Seizures Brother         Grand mal/petit mal seizures     SLE Other     Multiple Sclerosis Other  ADHD/ADD Other     Unspecified Intellectual Disability Other      Oncology History    No problem history exists.        Medications Prior to Admission       Prescriptions    ARIPiprazole (ABILIFY) 5 mg Oral Tablet    Take 1 Tablet (5 mg total) by mouth Once a day    Patient not taking:  Reported on 04/28/2024    aspirin  81 mg Oral Tablet, Chewable    Chew 1 Tablet (81 mg total) Once a day    promethazine  (PHENERGAN ) 25 mg Oral Tablet    Take 1 Tablet (25 mg total) by mouth Every 8 hours as needed for Nausea/Vomiting    traZODone (DESYREL) 50 mg Oral Tablet    Take 1 Tablet (50 mg total) by mouth Every night    valACYclovir (VALTREX) 1 gram Oral Tablet    Take 1 Tablet (1 g total) by mouth Twice daily            Allergies: Allergies[2]    Above history reviewed with patient.  Allergies, medication list, reviewed.       Review of Systems - All other systems reviewed and are negative unless noted in HPI.      Physical exam:  Body mass index is 27.34 kg/m.  ED Triage Vitals [07/11/24 2218]   BP (Non-Invasive) 122/86   Heart Rate 85   Respiratory Rate 16   Temperature 37 C (98.6 F)   SpO2 98 %   Weight 63.5 kg (140 lb)   Height 1.524 m (5')       Physical Exam  Vitals and nursing note reviewed.    Constitutional:       Appearance: Normal appearance.   Cardiovascular:      Rate and Rhythm: Normal rate and regular rhythm.   Pulmonary:      Effort: Pulmonary effort is normal.      Breath sounds: Normal breath sounds.   Abdominal:      General: Abdomen is flat.      Tenderness: There is abdominal tenderness (moderate epigastric with mild bilateral lower quadrant tenderness). There is no guarding or rebound.   Neurological:      Mental Status: She is alert.   Psychiatric:         Mood and Affect: Mood normal.         Behavior: Behavior normal.         Thought Content: Thought content normal.         Judgment: Judgment normal.          The following orders were placed:  Orders Placed This Encounter    CBC/DIFF    COMPREHENSIVE METABOLIC PANEL, NON-FASTING    LIPASE     URINALYSIS WITH REFLEX MICROSCOPIC AND CULTURE IF POSITIVE    CBC WITH DIFF    URINALYSIS, MACRO/MICRO      No orders to display      Labs Ordered/Reviewed - No data to display   No results found for this or any previous visit (from the past 12 hours).      ED Course:     ED Course as of 07/12/24 0003   Tue Jul 11, 2024   2314 LIPASE : 58   2314 BUN: 17   2314 CREATININE: 0.75   2314 WBC: 8.1   2314 HGB: 12.4   2314 PLATELET COUNT: 324   Wed Jul 12, 2024   0003 KETONES(!): Trace  0003 NITRITE: Negative   0003 LEUKOCYTES: Negative   0003 RBC'S(!): 11-15   0003 WBC'S: 0-2   0003 CARBON DIOXIDE(!): 21   0003 BUN: 17   0003 CREATININE: 0.75   0003 WBC: 8.1   0003 HGB: 12.4   0003 HCT: 35.7   0003 PLATELET COUNT: 324   0003 LIPASE : 58            Emergency Department Procedure:    EKG Interpertation:    Procedures    ED events:                       Clinical Impression:   Clinical Impression   None       Disposition: Data Unavailable        Medical Decision Making:    Ddx (including but not limited to):     Data (all images independently viewed by me): nurses notes and vital signs reviewed    Labs:     Imaging:     Records:  n/a    Consult/Discussion:    Plan:      Findings and diagnosis discussed with patient/family who is/are agreeable with plan            No follow-ups on file.   New Prescriptions    No medications on file      No follow-up provider specified.    Future Appointments  Future Appointments   Date Time Provider Department Center   04/27/2025  3:00 PM Pyles, Jama LABOR, MD [facility]        BP 122/86   Pulse 85   Temp 37 C (98.6 F)   Resp 16   Ht 1.524 m (5')   Wt 63.5 kg (140 lb)   SpO2 98%   BMI 27.34 kg/m        Jayson ORN. Jackquline DO 07/11/2024              This note was partially created using voice recognition software and is inherently subject to errors including those of syntax and sound alike  substitutions which may escape proof reading.  In such instances, original meaning may be extrapolated by contextual derivation.         [1]  Social History  Tobacco Use    Smoking status: Former     Types: Cigarettes    Smokeless tobacco: Never    Tobacco comments:     Former smoker - stopped when pregnant    Substance Use Topics    Alcohol use: No     Alcohol/week: 0.0 standard drinks of alcohol    Drug use: No   [2]  No Known Allergies

## 2024-07-12 ENCOUNTER — Emergency Department (HOSPITAL_COMMUNITY): Payer: MEDICAID

## 2024-07-12 MED ORDER — AMOXICILLIN 875 MG-POTASSIUM CLAVULANATE 125 MG TABLET
1.0000 | ORAL_TABLET | ORAL | Status: AC
  Administered 2024-07-12: 1 via ORAL
  Filled 2024-07-12: qty 1

## 2024-07-12 MED ORDER — AMOXICILLIN 875 MG-POTASSIUM CLAVULANATE 125 MG TABLET: 1.0000 | ORAL_TABLET | Freq: Two times a day (BID) | ORAL | 0 refills | Status: AC

## 2024-07-12 MED ORDER — ONDANSETRON 4 MG DISINTEGRATING TABLET: 4.0000 mg | ORAL_TABLET | Freq: Three times a day (TID) | ORAL | 0 refills | Status: AC | PRN

## 2024-07-12 NOTE — ED Nurses Note (Signed)
VSS. Resp WDL. No c/o voiced or s/s distress noted. D/C instructions per AVS given to pt verbally and written with understanding voiced. Pt D/C home ambulatory with significant other.

## 2024-07-12 NOTE — Discharge Instructions (Signed)
 You were seen in the ED for evaluation of vomiting and diarrhea with abdominal discomfort.  While it is not clear exactly what is causing your symptoms I do think it you may have a low-grade bacterial infection of your colon.  I do not believe this is C diff at this time but it certainly could be.  I have started you on some antibiotics and we would like you to monitor your symptoms.  I would like you to schedule an appointment with your doctor at [facility] for a follow up appointment in 1 to 2 weeks.  If your symptoms are getting worse then you will need to be re-evaluated in the emergency department.

## 2024-07-19 ENCOUNTER — Encounter (INDEPENDENT_AMBULATORY_CARE_PROVIDER_SITE_OTHER): Payer: Self-pay

## 2024-08-08 ENCOUNTER — Other Ambulatory Visit: Payer: Self-pay

## 2024-08-08 ENCOUNTER — Emergency Department (HOSPITAL_COMMUNITY): Payer: MEDICAID

## 2024-08-08 ENCOUNTER — Encounter (HOSPITAL_COMMUNITY): Payer: Self-pay

## 2024-08-08 ENCOUNTER — Emergency Department
Admission: EM | Admit: 2024-08-08 | Discharge: 2024-08-08 | Disposition: A | Discharge status: Home / Self Care | Payer: MEDICAID

## 2024-08-08 DIAGNOSIS — R9431 Abnormal electrocardiogram [ECG] [EKG]: Secondary | ICD-10-CM | POA: Insufficient documentation

## 2024-08-08 DIAGNOSIS — I4510 Unspecified right bundle-branch block: Secondary | ICD-10-CM | POA: Insufficient documentation

## 2024-08-08 DIAGNOSIS — R079 Chest pain, unspecified: Secondary | ICD-10-CM | POA: Insufficient documentation

## 2024-08-08 DIAGNOSIS — Z793 Long term (current) use of hormonal contraceptives: Secondary | ICD-10-CM | POA: Insufficient documentation

## 2024-08-08 DIAGNOSIS — Z952 Presence of prosthetic heart valve: Secondary | ICD-10-CM | POA: Insufficient documentation

## 2024-08-08 LAB — COVID-19 ~~LOC~~ MOLECULAR LAB TESTING
INFLUENZA VIRUS TYPE A: NOT DETECTED
INFLUENZA VIRUS TYPE B: NOT DETECTED
RESPIRATORY SYNCYTIAL VIRUS (RSV): NOT DETECTED
SARS-CoV-2: NOT DETECTED

## 2024-08-08 LAB — COMPREHENSIVE METABOLIC PANEL, NON-FASTING
ALBUMIN: 4.3 g/dL (ref 3.5–5.0)
ALKALINE PHOSPHATASE: 64 U/L (ref 40–110)
ALT (SGPT): 16 U/L (ref <31)
ANION GAP: 11 mmol/L (ref 4–13)
AST (SGOT): 20 U/L (ref 11–34)
BILIRUBIN TOTAL: 0.2 mg/dL — ABNORMAL LOW (ref 0.3–1.3)
BUN/CREA RATIO: 17 (ref 6–22)
BUN: 13 mg/dL (ref 8–25)
CALCIUM: 10 mg/dL (ref 8.6–10.2)
CHLORIDE: 109 mmol/L (ref 96–111)
CO2 TOTAL: 20 mmol/L — ABNORMAL LOW (ref 22–30)
CREATININE: 0.77 mg/dL (ref 0.60–1.05)
GLUCOSE: 91 mg/dL (ref 65–125)
POTASSIUM: 3.4 mmol/L — ABNORMAL LOW (ref 3.5–5.1)
PROTEIN TOTAL: 7.2 g/dL (ref 6.4–8.3)
SODIUM: 140 mmol/L (ref 136–145)
eGFRcr - FEMALE: >90 mL/min/{1.73_m2} (ref >=60)

## 2024-08-08 LAB — CBC WITH DIFF
BASOPHIL #: <0.1 10*3/uL (ref <=0.20)
BASOPHIL %: 0.8 %
EOSINOPHIL #: 0.15 10*3/uL (ref <=0.50)
EOSINOPHIL %: 1.8 %
HCT: 36.9 % (ref 34.8–46.0)
HGB: 12.8 g/dL (ref 11.5–16.0)
IMMATURE GRANULOCYTE #: <0.1 10*3/uL (ref <0.10)
IMMATURE GRANULOCYTE %: 0.2 % (ref 0.0–1.0)
LYMPHOCYTE #: 3.87 10*3/uL (ref 1.00–4.80)
LYMPHOCYTE %: 45.6 %
MCH: 26.8 pg (ref 26.0–32.0)
MCHC: 34.7 g/dL (ref 31.0–35.5)
MCV: 77.4 fL — ABNORMAL LOW (ref 78.0–100.0)
MONOCYTE #: 0.56 10*3/uL (ref 0.20–1.10)
MONOCYTE %: 6.6 %
MPV: 8.3 fL — ABNORMAL LOW (ref 8.7–12.5)
NEUTROPHIL #: 3.82 10*3/uL (ref 1.50–7.70)
NEUTROPHIL %: 45 %
PLATELETS: 341 10*3/uL (ref 150–400)
RBC: 4.77 10*6/uL (ref 3.85–5.22)
RDW-CV: 14.6 % (ref 11.5–15.5)
WBC: 8.5 10*3/uL (ref 3.7–11.0)

## 2024-08-08 LAB — TROPONIN-I: TROPONIN-I HS: <2.7 ng/L (ref <=14.0)

## 2024-08-08 LAB — ECG 12-LEAD
Atrial Rate: 84 {beats}/min
Calculated P Axis: 42 degrees
Calculated R Axis: -24 degrees
Calculated T Axis: 65 degrees
PR Interval: 148 ms
QRS Duration: 144 ms
QT Interval: 406 ms
QTC Calculation: 479 ms
Ventricular rate: 84 {beats}/min

## 2024-08-08 LAB — HCG, SERUM QUALITATIVE, PREGNANCY: PREGNANCY, SERUM QUALITATIVE: NEGATIVE

## 2024-08-08 LAB — LIPASE: LIPASE: 59 U/L (ref 10–60)

## 2024-08-08 LAB — D-DIMER: D-DIMER: <215 ng{FEU}/mL (ref <=500)

## 2024-08-08 LAB — LACTIC ACID LEVEL W/ REFLEX FOR LEVEL >2.0: LACTIC ACID: 1.3 mmol/L (ref 0.5–2.2)

## 2024-08-08 LAB — B-TYPE NATRIURETIC PEPTIDE (BNP),PLASMA: NT-PROBNP: 18.3 pg/mL (ref <300.0)

## 2024-08-08 MED ORDER — GI COCKTAIL (ANTACID SUSP, HYOSCYAMINE, LIDOCAINE)
55.0000 mL | ORAL | Status: AC
  Administered 2024-08-08: 55 mL via ORAL
  Filled 2024-08-08: qty 55

## 2024-08-08 NOTE — ED Provider Notes (Signed)
 [facility] - Emergency Department  ED Primary Note  History of Present Illness   [name] is a 24 y.o. female who had concerns including Epigastric Pain and Chest Pain .  Patient states has been ongoing for about 1 day.  She will have very brief episodes of epigastric abdominal pain that radiates bilaterally cross her ribcage.  Nothing seems to trigger the symptoms.  It isn't associated with diaphoresis, lightheadedness, vomiting, shortness of breath.  She has never experienced pain like this before.  She denies any history of abdominal surgeries.  Denies any diarrhea, fever but has had a cough for about 1 day.    Patient has a history of tetralogy of Fallot and absent pulmonary valve syndrome.  She 1st underwent surgery into it has been 2 and then had pulmonary valve replacement with a bioprosthetic valve and bovine pericardial patch in 2009.  Patient then underwent excision of the bioprosthetic pulmonary valve and this was replaced with a porcine by out prosthetic valve in 2015.  She states her most recent echocardiogram was reassuring.  This was at her visit last fall.  Patient denies any history of blood clots.  Denies any leg swelling, hemoptysis, history of cancer, is on the Depo shot, denies any recent surgery or periods of immobilization    Physical Exam   ED Triage Vitals [08/08/24 1651]   BP (Non-Invasive) 124/76   Heart Rate 85   Resp    Temperature (!) 35.9 C (96.7 F)   SpO2 100 %   Weight 59 kg (130 lb)   Height 1.524 m (5')     Physical Exam  Vitals and nursing note reviewed.   Constitutional:       General: She is not in acute distress.     Appearance: She is well-developed. She is not ill-appearing.   HENT:      Head: Normocephalic and atraumatic.   Eyes:      Conjunctiva/sclera: Conjunctivae normal.   Cardiovascular:      Rate and Rhythm: Normal rate and regular rhythm.      Heart sounds: No murmur heard.  Pulmonary:      Effort: Pulmonary effort is  normal. No respiratory distress.      Breath sounds: Normal breath sounds.   Abdominal:      Palpations: Abdomen is soft.      Tenderness: There is no abdominal tenderness.   Musculoskeletal:         General: No swelling.      Cervical back: Neck supple.   Skin:     General: Skin is warm and dry.      Capillary Refill: Capillary refill takes less than 2 seconds.   Neurological:      Mental Status: She is alert.   Psychiatric:         Mood and Affect: Mood normal.       Patient Data   Labs Ordered/Reviewed   COMPREHENSIVE METABOLIC PANEL, NON-FASTING - Abnormal; Notable for the following components:       Result Value    POTASSIUM 3.4 (*)     CO2 TOTAL 20 (*)     BILIRUBIN TOTAL 0.2 (*)     All other components within normal limits   CBC WITH DIFF - Abnormal; Notable for the following components:    MCV 77.4 (*)     MPV 8.3 (*)     All other components within normal limits   HCG, SERUM QUALITATIVE, PREGNANCY -  Normal   LACTIC ACID LEVEL W/ REFLEX FOR LEVEL >2.0 - Normal   LIPASE  - Normal   B-TYPE NATRIURETIC PEPTIDE (BNP),PLASMA - Normal   TROPONIN-I - Normal   D-DIMER - Normal    Narrative:     This assay has high sensitivity (100%) and negative predictive value (100%) for DVT and PE at a 500 ng/mL FEU cutoff according to the manufacturer. It has lower specificity depending on the pre-test probability of DVT or PE. Clinical correlation required.   COVID-19 [place] [facility] TESTING - Normal    Narrative:     Results are for the simultaneous qualitative identification of SARS-CoV-2 (formerly 2019-nCoV), Influenza A, Influenza B, and RSV RNA. These etiologic agents are generally detectable in nasopharyngeal and nasal swabs during the ACUTE PHASE of infection. Hence, this test is intended to be performed on respiratory specimens collected from individuals with signs and symptoms of upper respiratory tract infection who meet Centers for Disease Control and Prevention (CDC) clinical and/or epidemiological criteria for  Coronavirus Disease 2019 (COVID-19) testing. CDC COVID-19 criteria for testing on human specimens is available at [facility] webpage information for Healthcare Professionals: Coronavirus Disease 2019 (COVID-19) ([email or web address]).     False-negative results may occur if the virus has genomic mutations, insertions, deletions, or rearrangements or if performed very early in the course of illness. Otherwise, negative results indicate virus specific RNA targets are not detected, however negative results do not preclude SARS-CoV-2 infection/COVID-19, Influenza, or Respiratory syncytial virus infection. Results should not be used as the sole basis for patient management decisions. Negative results must be combined with clinical observations, patient history, and epidemiological information. If upper respiratory tract infection is still suspected based on exposure history together with other clinical findings, re-testing should be considered.    Test methodology:   Cepheid Xpert Xpress SARS-CoV-2/Flu/RSV Assay real-time polymerase chain reaction (RT-PCR) test on the GeneXpert Dx and Xpert Xpress systems.   CBC/DIFF    Narrative:     The following orders were created for panel order CBC/DIFF.  Procedure                               Abnormality         Status                     ---------                               -----------         ------                     CBC WITH IPQQ[[id number]]                Abnormal            Final result                 Please view results for these tests on the individual orders.   EXTRA TUBES    Narrative:     The following orders were created for panel order EXTRA TUBES.  Procedure                               Abnormality         Status                     ---------                               -----------         ------  BLUE TOP ULAZ[[id number]]                                    In process                 RED TOP TUBE[[id number]]                                      In process                 LIGHT GREEN TOP ULAZ[[id number]]                             In process                 LAVENDER TOP TUBE[[id number]]                                In process                 GRAY TOP ULAZ[[id number]]                                    In process                   Please view results for these tests on the individual orders.   BLUE TOP TUBE   RED TOP TUBE   LIGHT GREEN TOP TUBE   LAVENDER TOP TUBE   GRAY TOP TUBE     * XR CHEST PA AND LATERAL   Final Result by Edi, Radresults In (01/27 1733)   No acute cardiopulmonary abnormality.                        Radiologist location ID: [id number]           Medical Decision Making        Medical Decision Making  Amount and/or Complexity of Data Reviewed  Labs: ordered.  Radiology: ordered. Decision-making details documented in ED Course.  ECG/medicine tests: ordered and independent interpretation performed. Decision-making details documented in ED Course.    This is a 24 year old female presenting with chest pain. Vitals in the ED are stable. On examination has a very well-appearing, nontoxic female in no acute distress.  She does not have any reproducible tenderness in her abdomen.    Differential includes ACS, PE, valvular issue, CHF, pneumothorax. Workup notable for the below:  ED Course as of 08/08/24 1835   Tue Aug 08, 2024   1717 CBC/DIFF(!)  Stable   1724 LACTIC ACID: 1.3   1724 PREGNANCY, SERUM QUALITATIVE: Negative   1724 D-DIMER: <215   1727 POTASSIUM(!): 3.4  We will replete.  Renal function stable.   1727 LIPASE : 59   1728 TROPONIN-I HS: <2.7   1728 NT-PROBNP: 18.3   1728 ECG 12-LEAD  EKG on my independent interpretation demonstrates sinus rhythm, ventricular rate of 84.  Patient does have right bundle-branch block with QTC to 479, no significant change from prior.   1734 * XR CHEST PA AND LATERAL  No acute cardiopulmonary abnormality.   1744 COMPREHENSIVE METABOLIC PANEL, NON-FASTING(!)  Renal function  stable     Patient is low risk per heart score.  I believe she is stable for discharge at this time.  Return precautions discussed.         Medications Ordered/Administered in the ED   GI cocktail (ANTACID SUSP, HYOSCYAMINE , LIDOCAINE ) oral solution (55 mL Oral Given 08/08/24 1737)     Clinical Impression   Chest pain, unspecified type (Primary)       Disposition: Discharged

## 2024-08-08 NOTE — Discharge Instructions (Signed)
 You were seen here for chest pain.  Your workup in the emergency department was reassuring that you are not having a heart attack or any other life-threatening cause of your chest pain at this time.  At this time, you were stable to follow up with your primary care doctor for further management.  Come back to emergency department for any new or worsening symptoms that include:  Worsening or severe crushing chest pain, passing out, not being able to breathe, being covered in sweat, nonstop vomiting, or any other symptoms that concern you.

## 2024-08-08 NOTE — ED Nurses Note (Signed)
 Discharge instructions provided to patient. Patient denies any questions. Follow up with PCP in one week. Return to ED if new or worsening symptoms. Patient refused wheelchair. Patient left ED ambulatory.

## 2024-12-05 ENCOUNTER — Ambulatory Visit: Payer: MEDICAID

## 2025-04-27 ENCOUNTER — Encounter (INDEPENDENT_AMBULATORY_CARE_PROVIDER_SITE_OTHER): Payer: Self-pay | Admitting: PEDIATRICS-PEDIATRIC CARDIOLOGY
